# Patient Record
Sex: Female | Born: 1955
Health system: Southern US, Community
[De-identification: ages and names within clinical notes are randomized; demographics above are authoritative.]

## PROBLEM LIST (undated history)

## (undated) DIAGNOSIS — B0229 Other postherpetic nervous system involvement: Secondary | ICD-10-CM

## (undated) DIAGNOSIS — E785 Hyperlipidemia, unspecified: Secondary | ICD-10-CM

## (undated) DIAGNOSIS — I10 Essential (primary) hypertension: Secondary | ICD-10-CM

## (undated) DIAGNOSIS — H409 Unspecified glaucoma: Secondary | ICD-10-CM

## (undated) DIAGNOSIS — G4730 Sleep apnea, unspecified: Secondary | ICD-10-CM

## (undated) DIAGNOSIS — Z86718 Personal history of other venous thrombosis and embolism: Secondary | ICD-10-CM

## (undated) DIAGNOSIS — M1990 Unspecified osteoarthritis, unspecified site: Secondary | ICD-10-CM

## (undated) HISTORY — DX: Hyperlipidemia, unspecified: E78.5

## (undated) HISTORY — DX: Unspecified glaucoma: H40.9

## (undated) HISTORY — DX: Sleep apnea, unspecified: G47.30

## (undated) HISTORY — DX: Personal history of other venous thrombosis and embolism: Z86.718

## (undated) HISTORY — DX: Other postherpetic nervous system involvement: B02.29

## (undated) HISTORY — DX: Unspecified osteoarthritis, unspecified site: M19.90

## (undated) HISTORY — DX: Essential (primary) hypertension: I10

---

## 2001-11-06 ENCOUNTER — Encounter (HOSPITAL_COMMUNITY): Admission: RE | Admit: 2001-11-06 | Discharge: 2001-12-06 | Payer: Self-pay | Admitting: Preventative Medicine

## 2002-07-10 ENCOUNTER — Emergency Department (HOSPITAL_COMMUNITY): Admission: EM | Admit: 2002-07-10 | Discharge: 2002-07-11 | Payer: Self-pay | Admitting: Emergency Medicine

## 2002-07-11 ENCOUNTER — Encounter: Payer: Self-pay | Admitting: Emergency Medicine

## 2002-12-25 ENCOUNTER — Encounter: Payer: Self-pay | Admitting: Obstetrics and Gynecology

## 2002-12-25 ENCOUNTER — Ambulatory Visit (HOSPITAL_COMMUNITY): Admission: RE | Admit: 2002-12-25 | Discharge: 2002-12-25 | Payer: Self-pay | Admitting: Obstetrics and Gynecology

## 2004-09-04 ENCOUNTER — Ambulatory Visit (HOSPITAL_COMMUNITY): Admission: RE | Admit: 2004-09-04 | Discharge: 2004-09-04 | Payer: Self-pay | Admitting: Obstetrics and Gynecology

## 2005-06-29 ENCOUNTER — Encounter: Admission: RE | Admit: 2005-06-29 | Discharge: 2005-06-29 | Payer: Self-pay | Admitting: Oncology

## 2005-06-29 ENCOUNTER — Ambulatory Visit (HOSPITAL_COMMUNITY): Payer: Self-pay | Admitting: Oncology

## 2005-12-31 ENCOUNTER — Encounter: Admission: RE | Admit: 2005-12-31 | Discharge: 2005-12-31 | Payer: Self-pay | Admitting: Oncology

## 2005-12-31 ENCOUNTER — Ambulatory Visit (HOSPITAL_COMMUNITY): Payer: Self-pay | Admitting: Oncology

## 2005-12-31 ENCOUNTER — Encounter (HOSPITAL_COMMUNITY): Admission: RE | Admit: 2005-12-31 | Discharge: 2006-01-30 | Payer: Self-pay | Admitting: Oncology

## 2005-12-31 ENCOUNTER — Ambulatory Visit (HOSPITAL_COMMUNITY): Admission: RE | Admit: 2005-12-31 | Discharge: 2005-12-31 | Payer: Self-pay | Admitting: Obstetrics and Gynecology

## 2007-01-24 ENCOUNTER — Ambulatory Visit (HOSPITAL_COMMUNITY): Admission: RE | Admit: 2007-01-24 | Discharge: 2007-01-24 | Payer: Self-pay | Admitting: Internal Medicine

## 2007-01-31 ENCOUNTER — Ambulatory Visit (HOSPITAL_COMMUNITY): Admission: RE | Admit: 2007-01-31 | Discharge: 2007-01-31 | Payer: Self-pay | Admitting: Obstetrics and Gynecology

## 2007-05-22 ENCOUNTER — Ambulatory Visit: Payer: Self-pay | Admitting: *Deleted

## 2008-06-23 ENCOUNTER — Ambulatory Visit (HOSPITAL_COMMUNITY): Admission: RE | Admit: 2008-06-23 | Discharge: 2008-06-23 | Payer: Self-pay | Admitting: Gastroenterology

## 2008-06-23 ENCOUNTER — Ambulatory Visit: Payer: Self-pay | Admitting: Gastroenterology

## 2008-10-19 ENCOUNTER — Ambulatory Visit (HOSPITAL_COMMUNITY): Admission: RE | Admit: 2008-10-19 | Discharge: 2008-10-19 | Payer: Self-pay | Admitting: Obstetrics and Gynecology

## 2009-11-08 ENCOUNTER — Ambulatory Visit (HOSPITAL_COMMUNITY): Admission: RE | Admit: 2009-11-08 | Discharge: 2009-11-08 | Payer: Self-pay | Admitting: Obstetrics and Gynecology

## 2010-09-10 ENCOUNTER — Encounter: Payer: Self-pay | Admitting: Obstetrics and Gynecology

## 2010-12-14 ENCOUNTER — Other Ambulatory Visit (HOSPITAL_COMMUNITY): Payer: Self-pay | Admitting: Obstetrics and Gynecology

## 2010-12-14 DIAGNOSIS — Z139 Encounter for screening, unspecified: Secondary | ICD-10-CM

## 2010-12-28 ENCOUNTER — Ambulatory Visit (HOSPITAL_COMMUNITY)
Admission: RE | Admit: 2010-12-28 | Discharge: 2010-12-28 | Disposition: A | Discharge status: Home / Self Care | Payer: BC Managed Care – PPO | Source: Ambulatory Visit | Attending: Obstetrics and Gynecology | Admitting: Obstetrics and Gynecology

## 2010-12-28 DIAGNOSIS — Z139 Encounter for screening, unspecified: Secondary | ICD-10-CM

## 2010-12-28 DIAGNOSIS — Z1231 Encounter for screening mammogram for malignant neoplasm of breast: Secondary | ICD-10-CM | POA: Insufficient documentation

## 2011-01-02 NOTE — Consult Note (Signed)
VASCULAR SURGERY CONSULTATION   Janice Gonzales, Janice Gonzales  DOB:  Jul 07, 1956                                       05/22/2007  XBDZH#:[id number]   REFERRING PHYSICIAN:  Patrica Duel, M.D.   REASON FOR CONSULTATION:  Bilateral leg swelling.   HISTORY:  Patient is a 55 year old female with a history of DVT and  pulmonary embolus two years ago.  This occurred following a long  automobile trip.  Also, she was on birth control pills at that time.  Workup for hypercoagulable state was negative.  She now complains of  bilateral ankle swelling.  This was most severe in the summertime.  She  does work at [employer].  She spends a moderate amount of  time on her feet.  She recently had her Diltiazem discontinued with  conversion to triamterene/hydrochlorothiazide with her hypertension  controlled.  This resulted in significant improvement in her ankle  swelling.  She does try to restrict salt intake.   She notes also some tingling of her toes and feet at times.   PAST MEDICAL HISTORY:  1. Hypertension.  2. Venous thromboembolism with pulmonary embolus.  3. Iron-deficiency anemia.   SOCIAL HISTORY:  She is recently remarried.  Has no children.  Works in  Airline pilot at [employer].  Nonsmoker.  Nondrinker.   MEDICATIONS:  1. Triamterene/hydrochlorothiazide 37.5/25 1 tablet daily.  2. Iron 325 mg daily.   ALLERGIES:  TETRACYCLINE.   REVIEW OF SYSTEMS:  She does have spider veins in her legs.  Notes  occasional redness around her ankles and calves with tightness and some  numbness in her feet.   PHYSICAL EXAMINATION:  A moderately obese 55 year old female.  Alert and  oriented.  No acute distress.  BP 160/109.  Pulse 65 per minute,  regular.  Respirations 18 per minute.  Lower extremity evaluation  reveals spider veins bilaterally.  No large varicosities are evident.  No pigmentation.  Ankle edema 1+.  Intact popliteal, posterior tibial,  and dorsalis  pedis pulses.   IMPRESSION:  1. Chronic lower extremity edema, recently improved by medication      change with discontinuation of Diltiazem.  2. Spider veins.  3. Hypertension.  4. History of venous thromboembolism.   RECOMMENDATIONS:  Continue to limit salt intake.  Leg elevation when  feasible.  Use support stockings below knee p.r.n.  At patient's  request, have given information regarding the vein clinic and treatment  of spider veins.   Balinda Quails, M.D.  Electronically Signed  [facility]/MEDQ  D:  05/22/2007  T:  05/23/2007  Job:  [id number]

## 2011-01-02 NOTE — Op Note (Signed)
Janice Gonzales, Janice Gonzales              ACCOUNT NO.:  [id number]   MEDICAL RECORD NO.:  [id number]          PATIENT TYPE:  AMB   LOCATION:  DAY                           FACILITY:  [facility]   PHYSICIAN:  Kassie Mends, M.D.      DATE OF BIRTH:  October 15, 1955   DATE OF PROCEDURE:  06/23/2008  DATE OF DISCHARGE:                               OPERATIVE REPORT   REFERRING Kymani Shimabukuro:  Melony Overly, PA   PROCEDURE:  Colonoscopy.   INDICATION FOR EXAM:  Janice Gonzales is a 55 year old female who presents  for average-risk colon cancer screening.  She does have a second-degree  relative that had polyps at age greater than 84.   FINDINGS:  1. Slightly tortuous colon.  Otherwise, normal colon without evidence      of polyps, mass, inflammatory changes, diverticular, or AVMs.  2. Normal retroflexed view of the rectum.   RECOMMENDATIONS:  1. Screening colonoscopy in 10 years.  2. She should follow a high-fiber diet.  She is given a handout on      high-fiber diet.   MEDICATIONS:  1. Demerol 75 mg IV.  2. Versed 4 mg IV.   PROCEDURE TECHNIQUE:  Physical exam was performed.  Informed consent was  obtained from the patient after explaining the benefits, risks, and  alternatives to the procedure.  The patient was connected to the monitor  and placed in left lateral position.  Continuous oxygen was provided by  nasal cannula and IV medicine administered through an indwelling  cannula.  After administration of sedation and rectal exam, the  patient's rectum was intubated  and the scope was advanced under direct visualization to the cecum.  The  scope was removed slowly by carefully examining the color, texture,  anatomy, and integrity of the mucosa on the way out.  The patient was  recovered in endoscopy and discharged home in satisfactory condition.      Kassie Mends, M.D.  Electronically Signed     SM/MEDQ  D:  06/23/2008  T:  06/23/2008  Job:  [id number]   cc:   Patrica Duel, M.D.  Fax: [phone]

## 2011-12-13 ENCOUNTER — Other Ambulatory Visit (HOSPITAL_COMMUNITY): Payer: Self-pay | Admitting: Physician Assistant

## 2011-12-13 DIAGNOSIS — Z139 Encounter for screening, unspecified: Secondary | ICD-10-CM

## 2011-12-31 ENCOUNTER — Ambulatory Visit (HOSPITAL_COMMUNITY)
Admission: RE | Admit: 2011-12-31 | Discharge: 2011-12-31 | Disposition: A | Discharge status: Home / Self Care | Payer: BC Managed Care – PPO | Source: Ambulatory Visit | Attending: Physician Assistant | Admitting: Physician Assistant

## 2011-12-31 DIAGNOSIS — Z1231 Encounter for screening mammogram for malignant neoplasm of breast: Secondary | ICD-10-CM | POA: Insufficient documentation

## 2011-12-31 DIAGNOSIS — Z139 Encounter for screening, unspecified: Secondary | ICD-10-CM

## 2012-01-29 ENCOUNTER — Other Ambulatory Visit (HOSPITAL_COMMUNITY): Payer: Self-pay | Admitting: Family Medicine

## 2012-01-29 ENCOUNTER — Ambulatory Visit (HOSPITAL_COMMUNITY)
Admission: RE | Admit: 2012-01-29 | Discharge: 2012-01-29 | Disposition: A | Discharge status: Home / Self Care | Payer: BC Managed Care – PPO | Source: Ambulatory Visit | Attending: Family Medicine | Admitting: Family Medicine

## 2012-01-29 DIAGNOSIS — M25569 Pain in unspecified knee: Secondary | ICD-10-CM | POA: Insufficient documentation

## 2012-01-30 ENCOUNTER — Ambulatory Visit (HOSPITAL_COMMUNITY)
Admission: RE | Admit: 2012-01-30 | Discharge: 2012-01-30 | Disposition: A | Discharge status: Home / Self Care | Payer: BC Managed Care – PPO | Source: Ambulatory Visit | Attending: Family Medicine | Admitting: Family Medicine

## 2012-01-30 DIAGNOSIS — M25569 Pain in unspecified knee: Secondary | ICD-10-CM

## 2012-01-30 DIAGNOSIS — M79609 Pain in unspecified limb: Secondary | ICD-10-CM | POA: Insufficient documentation

## 2014-02-12 ENCOUNTER — Other Ambulatory Visit (HOSPITAL_COMMUNITY): Payer: Self-pay | Admitting: Obstetrics & Gynecology

## 2014-02-12 DIAGNOSIS — Z1231 Encounter for screening mammogram for malignant neoplasm of breast: Secondary | ICD-10-CM

## 2014-03-12 ENCOUNTER — Ambulatory Visit (HOSPITAL_COMMUNITY): Payer: BC Managed Care – PPO

## 2014-03-17 ENCOUNTER — Ambulatory Visit (HOSPITAL_COMMUNITY)
Admission: RE | Admit: 2014-03-17 | Discharge: 2014-03-17 | Disposition: A | Discharge status: Home / Self Care | Payer: BC Managed Care – PPO | Source: Ambulatory Visit | Attending: Obstetrics & Gynecology | Admitting: Obstetrics & Gynecology

## 2014-03-17 DIAGNOSIS — Z1231 Encounter for screening mammogram for malignant neoplasm of breast: Secondary | ICD-10-CM | POA: Insufficient documentation

## 2015-07-19 ENCOUNTER — Other Ambulatory Visit (HOSPITAL_COMMUNITY): Payer: Self-pay | Admitting: Obstetrics & Gynecology

## 2015-07-19 DIAGNOSIS — Z1231 Encounter for screening mammogram for malignant neoplasm of breast: Secondary | ICD-10-CM

## 2015-08-03 ENCOUNTER — Ambulatory Visit (HOSPITAL_COMMUNITY): Payer: Self-pay

## 2015-08-03 ENCOUNTER — Ambulatory Visit (HOSPITAL_COMMUNITY)
Admission: RE | Admit: 2015-08-03 | Discharge: 2015-08-03 | Disposition: A | Discharge status: Home / Self Care | Payer: BLUE CROSS/BLUE SHIELD | Source: Ambulatory Visit | Attending: Obstetrics & Gynecology | Admitting: Obstetrics & Gynecology

## 2015-08-03 DIAGNOSIS — Z1231 Encounter for screening mammogram for malignant neoplasm of breast: Secondary | ICD-10-CM | POA: Diagnosis present

## 2016-07-03 DIAGNOSIS — L821 Other seborrheic keratosis: Secondary | ICD-10-CM | POA: Diagnosis not present

## 2016-07-03 DIAGNOSIS — L570 Actinic keratosis: Secondary | ICD-10-CM | POA: Diagnosis not present

## 2016-07-03 DIAGNOSIS — D1800 Hemangioma unspecified site: Secondary | ICD-10-CM | POA: Diagnosis not present

## 2016-10-01 ENCOUNTER — Other Ambulatory Visit (HOSPITAL_COMMUNITY): Payer: Self-pay | Admitting: Obstetrics & Gynecology

## 2016-10-01 DIAGNOSIS — Z1231 Encounter for screening mammogram for malignant neoplasm of breast: Secondary | ICD-10-CM

## 2016-10-02 DIAGNOSIS — H40023 Open angle with borderline findings, high risk, bilateral: Secondary | ICD-10-CM | POA: Diagnosis not present

## 2016-10-11 DIAGNOSIS — H8110 Benign paroxysmal vertigo, unspecified ear: Secondary | ICD-10-CM | POA: Diagnosis not present

## 2016-10-11 DIAGNOSIS — Z6833 Body mass index (BMI) 33.0-33.9, adult: Secondary | ICD-10-CM | POA: Diagnosis not present

## 2016-10-11 DIAGNOSIS — Z1389 Encounter for screening for other disorder: Secondary | ICD-10-CM | POA: Diagnosis not present

## 2016-10-11 DIAGNOSIS — I10 Essential (primary) hypertension: Secondary | ICD-10-CM | POA: Diagnosis not present

## 2016-10-23 DIAGNOSIS — Z6833 Body mass index (BMI) 33.0-33.9, adult: Secondary | ICD-10-CM | POA: Diagnosis not present

## 2016-10-23 DIAGNOSIS — Z1389 Encounter for screening for other disorder: Secondary | ICD-10-CM | POA: Diagnosis not present

## 2016-10-23 DIAGNOSIS — I10 Essential (primary) hypertension: Secondary | ICD-10-CM | POA: Diagnosis not present

## 2016-10-23 DIAGNOSIS — H8110 Benign paroxysmal vertigo, unspecified ear: Secondary | ICD-10-CM | POA: Diagnosis not present

## 2016-10-24 ENCOUNTER — Ambulatory Visit (HOSPITAL_COMMUNITY): Payer: BLUE CROSS/BLUE SHIELD

## 2016-10-24 DIAGNOSIS — H40023 Open angle with borderline findings, high risk, bilateral: Secondary | ICD-10-CM | POA: Diagnosis not present

## 2016-10-29 ENCOUNTER — Ambulatory Visit (HOSPITAL_COMMUNITY)
Admission: RE | Admit: 2016-10-29 | Discharge: 2016-10-29 | Disposition: A | Discharge status: Home / Self Care | Payer: BLUE CROSS/BLUE SHIELD | Source: Ambulatory Visit | Attending: Obstetrics & Gynecology | Admitting: Obstetrics & Gynecology

## 2016-10-29 DIAGNOSIS — Z1231 Encounter for screening mammogram for malignant neoplasm of breast: Secondary | ICD-10-CM | POA: Insufficient documentation

## 2016-11-20 DIAGNOSIS — Z01419 Encounter for gynecological examination (general) (routine) without abnormal findings: Secondary | ICD-10-CM | POA: Diagnosis not present

## 2016-11-20 DIAGNOSIS — Z6833 Body mass index (BMI) 33.0-33.9, adult: Secondary | ICD-10-CM | POA: Diagnosis not present

## 2016-12-13 ENCOUNTER — Ambulatory Visit (INDEPENDENT_AMBULATORY_CARE_PROVIDER_SITE_OTHER): Payer: BLUE CROSS/BLUE SHIELD | Admitting: Otolaryngology

## 2016-12-13 DIAGNOSIS — H608X3 Other otitis externa, bilateral: Secondary | ICD-10-CM | POA: Diagnosis not present

## 2016-12-13 DIAGNOSIS — R42 Dizziness and giddiness: Secondary | ICD-10-CM

## 2016-12-13 DIAGNOSIS — H903 Sensorineural hearing loss, bilateral: Secondary | ICD-10-CM

## 2016-12-24 DIAGNOSIS — Z6832 Body mass index (BMI) 32.0-32.9, adult: Secondary | ICD-10-CM | POA: Diagnosis not present

## 2016-12-24 DIAGNOSIS — Z1389 Encounter for screening for other disorder: Secondary | ICD-10-CM | POA: Diagnosis not present

## 2016-12-24 DIAGNOSIS — J811 Chronic pulmonary edema: Secondary | ICD-10-CM | POA: Diagnosis not present

## 2016-12-24 DIAGNOSIS — R0982 Postnasal drip: Secondary | ICD-10-CM | POA: Diagnosis not present

## 2016-12-24 DIAGNOSIS — R05 Cough: Secondary | ICD-10-CM | POA: Diagnosis not present

## 2017-05-14 DIAGNOSIS — Z6832 Body mass index (BMI) 32.0-32.9, adult: Secondary | ICD-10-CM | POA: Diagnosis not present

## 2017-05-14 DIAGNOSIS — E6609 Other obesity due to excess calories: Secondary | ICD-10-CM | POA: Diagnosis not present

## 2017-05-14 DIAGNOSIS — B029 Zoster without complications: Secondary | ICD-10-CM | POA: Diagnosis not present

## 2017-05-20 DIAGNOSIS — B029 Zoster without complications: Secondary | ICD-10-CM | POA: Diagnosis not present

## 2017-05-20 DIAGNOSIS — Z1389 Encounter for screening for other disorder: Secondary | ICD-10-CM | POA: Diagnosis not present

## 2017-05-20 DIAGNOSIS — E6609 Other obesity due to excess calories: Secondary | ICD-10-CM | POA: Diagnosis not present

## 2017-05-20 DIAGNOSIS — Z6832 Body mass index (BMI) 32.0-32.9, adult: Secondary | ICD-10-CM | POA: Diagnosis not present

## 2017-06-17 ENCOUNTER — Encounter: Payer: Self-pay | Admitting: Family Medicine

## 2017-06-17 ENCOUNTER — Ambulatory Visit (INDEPENDENT_AMBULATORY_CARE_PROVIDER_SITE_OTHER): Payer: BLUE CROSS/BLUE SHIELD | Admitting: Family Medicine

## 2017-06-17 VITALS — BP 118/74 | HR 88 | Temp 97.4°F | Resp 16 | Ht 65.0 in | Wt 193.0 lb

## 2017-06-17 DIAGNOSIS — E785 Hyperlipidemia, unspecified: Secondary | ICD-10-CM

## 2017-06-17 DIAGNOSIS — E559 Vitamin D deficiency, unspecified: Secondary | ICD-10-CM | POA: Diagnosis not present

## 2017-06-17 DIAGNOSIS — E538 Deficiency of other specified B group vitamins: Secondary | ICD-10-CM

## 2017-06-17 DIAGNOSIS — Z86711 Personal history of pulmonary embolism: Secondary | ICD-10-CM | POA: Diagnosis not present

## 2017-06-17 DIAGNOSIS — Z23 Encounter for immunization: Secondary | ICD-10-CM | POA: Diagnosis not present

## 2017-06-17 DIAGNOSIS — I10 Essential (primary) hypertension: Secondary | ICD-10-CM | POA: Diagnosis not present

## 2017-06-17 DIAGNOSIS — Z1159 Encounter for screening for other viral diseases: Secondary | ICD-10-CM

## 2017-06-17 DIAGNOSIS — B0229 Other postherpetic nervous system involvement: Secondary | ICD-10-CM

## 2017-06-17 HISTORY — DX: Other postherpetic nervous system involvement: B02.29

## 2017-06-17 MED ORDER — GABAPENTIN 300 MG PO CAPS: 300.0000 mg | ORAL_CAPSULE | Freq: Every day | ORAL | 2 refills | Status: DC

## 2017-06-17 NOTE — Patient Instructions (Addendum)
Need flu shot Need blood tests I will send you a letter with your test results.  If there is anything of concern, we will call right away. Need old records - PCP and GYN Try the gabapentin before bed for the nerve pain  See me in a month for a PE

## 2017-06-17 NOTE — Progress Notes (Signed)
Chief Complaint  Patient presents with  . Follow-up    est  This is a new patient to establish. Essential hypertension on Cozaar 100 mg a day.  Compliant with medication.  Compliant with diet and salt restriction.  Not compliant with daily exercise.  Mild overweight.  Blood pressures well controlled. Remote history of DVT and pulmonary embolism in 2006.  The patient was at the time on oral contraceptives.  These were discontinued.  She is never been a smoker. Patient had shingles about 5 weeks ago.  She still has shingles pain.  The rash is slowly resolving.  She was treated with valacyclovir.  She took tramadol for pain.  She is has trouble especially in the afternoon and evening.  She questions when she can get a shingles shot. Pap smears and mammogram are up-to-date.  She sees a GYN regularly. She had a colonoscopy in 2009 at age 61.  She is due again next year     Patient Active Problem List   Diagnosis Date Noted  . Essential hypertension 06/17/2017  . Post herpetic neuralgia 06/17/2017  . History of pulmonary embolism 06/17/2017    Outpatient Encounter Prescriptions as of 06/17/2017  Medication Sig  . aspirin EC 81 MG tablet Take 81 mg by mouth daily.  Marland Kitchen. gabapentin (NEURONTIN) 300 MG capsule Take 1 capsule (300 mg total) by mouth at bedtime.  Marland Kitchen. losartan (COZAAR) 100 MG tablet Take 100 mg by mouth daily.   No facility-administered encounter medications on file as of 06/17/2017.     Past Medical History:  Diagnosis Date  . Arthritis   . Glaucoma   . History of DVT of lower extremity    with PE  . Hypertension   . Post herpetic neuralgia 06/17/2017  . Sleep apnea     History reviewed. No pertinent surgical history.  Social History   Social History  . Marital status: Married    Spouse name: Jonny RuizJohn  . Number of children: 0  . Years of education: 7616   Occupational History  . [employer]    Social History Main Topics  . Smoking status: Never Smoker    . Smokeless tobacco: Never Used  . Alcohol use No  . Drug use: No  . Sexual activity: Yes    Birth control/ protection: Post-menopausal   Other Topics Concern  . Not on file   Social History Narrative   BS in business admin   Married to The PepsiJohn   Lives to walk   Had many step children and grandchildren who keep her active    Family History  Problem Relation Age of Onset  . Diabetes Mother   . Hyperlipidemia Mother   . Stroke Mother   . Alcohol abuse Father   . Arthritis Father   . COPD Father   . Heart disease Father   . Hypertension Father   . Heart disease Maternal Grandmother        heart attack  . Cancer Paternal Grandmother        breast cancer    Review of Systems  Constitutional: Negative for chills, fever and weight loss.  HENT: Negative for congestion and hearing loss.   Eyes: Negative for blurred vision and pain.  Respiratory: Negative for cough and shortness of breath.   Cardiovascular: Negative for chest pain and leg swelling.  Gastrointestinal: Negative for abdominal pain, constipation, diarrhea and heartburn.  Genitourinary: Positive for flank pain. Negative for dysuria and frequency.  Flank pain from herpetic neuralgia  Musculoskeletal: Negative for falls, joint pain and myalgias.  Neurological: Negative for dizziness, seizures and headaches.  Psychiatric/Behavioral: Negative for depression. The patient is not nervous/anxious and does not have insomnia.     BP 118/74 (BP Location: Right Arm, Patient Position: Sitting, Cuff Size: Normal)   Pulse 88   Temp (!) 97.4 F (36.3 C) (Temporal)   Resp 16   Ht 5\' 5"  (1.651 m)   Wt 193 lb 0.6 oz (87.6 kg)   SpO2 97%   BMI 32.12 kg/m   Physical Exam  Constitutional: She is oriented to person, place, and time. She appears well-developed and well-nourished.  HENT:  Head: Normocephalic and atraumatic.  Mouth/Throat: Oropharynx is clear and moist.  Eyes: Pupils are equal, round, and reactive to light.  Conjunctivae are normal.  Neck: Normal range of motion. Neck supple. No thyromegaly present.  Cardiovascular: Normal rate, regular rhythm and normal heart sounds.   Pulmonary/Chest: Effort normal and breath sounds normal. No respiratory distress.        Abdominal: Soft. Bowel sounds are normal.  Musculoskeletal: Normal range of motion. She exhibits no edema.  Lymphadenopathy:    She has no cervical adenopathy.  Neurological: She is alert and oriented to person, place, and time.  Gait normal  Skin: Skin is warm and dry.  Psychiatric: She has a normal mood and affect. Her behavior is normal. Thought content normal.  Nursing note and vitals reviewed.  ASSESSMENT/PLAN:  1. Essential hypertension Controlled - CBC - COMPLETE METABOLIC PANEL WITH GFR - Urinalysis, Routine w reflex microscopic  2. Post herpetic neuralgia Persistent pain.  We will add gabapentin at bedtime.  3. History of pulmonary embolism Remote history.  Compliant with aspirin daily  4. Hyperlipidemia, mild History of hyperlipidemia.  On no treatment. - Lipid panel  5. Encounter for hepatitis C screening test for low risk patient Discussed hepatitis C.  Patient desires testing.  She is low risk - Hepatitis C antibody  6. Vitamin D deficiency History of - VITAMIN D 25 Hydroxy (Vit-D Deficiency, Fractures)  7. Vitamin B 12 deficiency History - Vitamin B12  8. Needs flu shot Done - Flu Vaccine QUAD 36+ mos IM   Patient Instructions  Need flu shot Need blood tests I will send you a letter with your test results.  If there is anything of concern, we will call right away. Need old records - PCP and GYN Try the gabapentin before bed for the nerve pain  See me in a month for a PE    Eustace Moore, MD

## 2017-06-22 LAB — URINALYSIS, ROUTINE W REFLEX MICROSCOPIC
BACTERIA UA: NONE SEEN /HPF
Bilirubin Urine: NEGATIVE
Glucose, UA: NEGATIVE
HGB URINE DIPSTICK: NEGATIVE
HYALINE CAST: NONE SEEN /LPF
KETONES UR: NEGATIVE
Nitrite: NEGATIVE
PH: 7 (ref 5.0–8.0)
Protein, ur: NEGATIVE
SPECIFIC GRAVITY, URINE: 1.015 (ref 1.001–1.03)

## 2017-06-22 LAB — LIPID PANEL
CHOL/HDL RATIO: 5 (calc) — AB (ref <5.0)
Cholesterol: 209 mg/dL — ABNORMAL HIGH (ref <200)
HDL: 42 mg/dL — AB (ref >50)
LDL CHOLESTEROL (CALC): 131 mg/dL — AB
NON-HDL CHOLESTEROL (CALC): 167 mg/dL — AB (ref <130)
Triglycerides: 217 mg/dL — ABNORMAL HIGH (ref <150)

## 2017-06-22 LAB — CBC
HEMATOCRIT: 38.2 % (ref 35.0–45.0)
HEMOGLOBIN: 12.7 g/dL (ref 11.7–15.5)
MCH: 27.5 pg (ref 27.0–33.0)
MCHC: 33.2 g/dL (ref 32.0–36.0)
MCV: 82.9 fL (ref 80.0–100.0)
MPV: 11.8 fL (ref 7.5–12.5)
Platelets: 206 10*3/uL (ref 140–400)
RBC: 4.61 10*6/uL (ref 3.80–5.10)
RDW: 13.4 % (ref 11.0–15.0)
WBC: 9.8 10*3/uL (ref 3.8–10.8)

## 2017-06-22 LAB — COMPLETE METABOLIC PANEL WITH GFR
AG Ratio: 1.7 (calc) (ref 1.0–2.5)
ALBUMIN MSPROF: 4.3 g/dL (ref 3.6–5.1)
ALT: 16 U/L (ref 6–29)
AST: 13 U/L (ref 10–35)
Alkaline phosphatase (APISO): 76 U/L (ref 33–130)
BUN: 15 mg/dL (ref 7–25)
CALCIUM: 9.1 mg/dL (ref 8.6–10.4)
CO2: 27 mmol/L (ref 20–32)
CREATININE: 0.79 mg/dL (ref 0.50–1.05)
Chloride: 102 mmol/L (ref 98–110)
GFR, EST NON AFRICAN AMERICAN: 82 mL/min/{1.73_m2} (ref >=60)
GFR, Est African American: 95 mL/min/{1.73_m2} (ref >=60)
GLOBULIN: 2.6 g/dL (ref 1.9–3.7)
GLUCOSE: 99 mg/dL (ref 65–99)
Potassium: 4.2 mmol/L (ref 3.5–5.3)
SODIUM: 139 mmol/L (ref 135–146)
Total Bilirubin: 0.7 mg/dL (ref 0.2–1.2)
Total Protein: 6.9 g/dL (ref 6.1–8.1)

## 2017-06-22 LAB — HEPATITIS C ANTIBODY
HEP C AB: NONREACTIVE
SIGNAL TO CUT-OFF: 0.01 (ref <1.00)

## 2017-06-22 LAB — VITAMIN D 25 HYDROXY (VIT D DEFICIENCY, FRACTURES): Vit D, 25-Hydroxy: 13 ng/mL — ABNORMAL LOW (ref 30–100)

## 2017-06-22 LAB — VITAMIN B12: VITAMIN B 12: 370 pg/mL (ref 200–1100)

## 2017-06-25 ENCOUNTER — Encounter: Payer: Self-pay | Admitting: Family Medicine

## 2017-07-03 DIAGNOSIS — L570 Actinic keratosis: Secondary | ICD-10-CM | POA: Diagnosis not present

## 2017-07-04 DIAGNOSIS — H40023 Open angle with borderline findings, high risk, bilateral: Secondary | ICD-10-CM | POA: Diagnosis not present

## 2017-07-05 ENCOUNTER — Encounter: Payer: Self-pay | Admitting: Family Medicine

## 2017-07-05 ENCOUNTER — Ambulatory Visit: Payer: BLUE CROSS/BLUE SHIELD | Admitting: Family Medicine

## 2017-07-05 ENCOUNTER — Other Ambulatory Visit: Payer: Self-pay

## 2017-07-05 VITALS — BP 130/84 | HR 84 | Temp 97.7°F | Resp 16 | Ht 65.0 in | Wt 189.1 lb

## 2017-07-05 DIAGNOSIS — E559 Vitamin D deficiency, unspecified: Secondary | ICD-10-CM | POA: Diagnosis not present

## 2017-07-05 DIAGNOSIS — E785 Hyperlipidemia, unspecified: Secondary | ICD-10-CM

## 2017-07-05 DIAGNOSIS — I10 Essential (primary) hypertension: Secondary | ICD-10-CM

## 2017-07-05 DIAGNOSIS — J209 Acute bronchitis, unspecified: Secondary | ICD-10-CM

## 2017-07-05 HISTORY — DX: Hyperlipidemia, unspecified: E78.5

## 2017-07-05 MED ORDER — AZITHROMYCIN 250 MG PO TABS: ORAL_TABLET | ORAL | 0 refills | Status: DC

## 2017-07-05 MED ORDER — BENZONATATE 100 MG PO CAPS: 100.0000 mg | ORAL_CAPSULE | Freq: Two times a day (BID) | ORAL | 0 refills | Status: DC | PRN

## 2017-07-05 MED ORDER — VITAMIN D (ERGOCALCIFEROL) 1.25 MG (50000 UNIT) PO CAPS: 50000.0000 [IU] | ORAL_CAPSULE | ORAL | 0 refills | Status: DC

## 2017-07-05 NOTE — Progress Notes (Signed)
Chief Complaint  Patient presents with  . Cough    x 1 week  Patient is here for cough and cold.  She is been coughing for a week.  The cough is keeping her awake at night.  She states she is prone to "bronchitis".  She has not had any fever.  She thinks she had some chills last night.  She does not have any chest pain.  Scant sputum.  No runny or stuffy nose, sinus pressure or pain, or sore throat.  No underlying COPD or asthma. While here, she would like to discuss her recent lab results.  Everything was normal or as expected except hyperlipidemia.  I calculated her Framingham risk of cardiovascular disease over the next 10 years and it is 11%.  I explained to her that that would be an indication to start a statin.  She would like to try diet and exercise instead.  We will repeat lipids in 6 months.  She also was found to be vitamin D deficient.  Her vitamin D level is 13.  We discussed causes of vitamin D deficiency, nutritional sources of vitamin D, and sunshine.  I am going to put her on vitamin D 50,000 units a week for 12 weeks, then she will take an over-the-counter product daily.   Patient Active Problem List   Diagnosis Date Noted  . Hyperlipidemia 07/05/2017  . Vitamin D deficiency 07/05/2017  . Essential hypertension 06/17/2017  . Post herpetic neuralgia 06/17/2017  . History of pulmonary embolism 06/17/2017    Outpatient Encounter Medications as of 07/05/2017  Medication Sig  . aspirin EC 81 MG tablet Take 81 mg by mouth daily.  Marland Kitchen. gabapentin (NEURONTIN) 300 MG capsule Take 1 capsule (300 mg total) by mouth at bedtime.  Marland Kitchen. losartan (COZAAR) 100 MG tablet Take 100 mg by mouth daily.  Marland Kitchen. azithromycin (ZITHROMAX) 250 MG tablet tad  . benzonatate (TESSALON) 100 MG capsule Take 1 capsule (100 mg total) 2 (two) times daily as needed by mouth for cough.  . Vitamin D, Ergocalciferol, (DRISDOL) 50000 units CAPS capsule Take 1 capsule (50,000 Units total) every 7 (seven) days by mouth.     No facility-administered encounter medications on file as of 07/05/2017.     Allergies  Allergen Reactions  . Tetracyclines & Related Itching  . Thimerosal Itching    Review of Systems  Constitutional: Positive for chills. Negative for activity change, appetite change, fatigue and unexpected weight change.  HENT: Negative for congestion, dental problem, postnasal drip and rhinorrhea.   Eyes: Negative for redness and visual disturbance.  Respiratory: Positive for cough. Negative for shortness of breath.   Cardiovascular: Negative for chest pain, palpitations and leg swelling.  Gastrointestinal: Negative for abdominal pain, constipation and diarrhea.  Genitourinary: Negative for difficulty urinating, frequency and menstrual problem.  Musculoskeletal: Negative for arthralgias and back pain.  Neurological: Negative for dizziness and headaches.  Psychiatric/Behavioral: Positive for sleep disturbance. Negative for dysphoric mood. The patient is not nervous/anxious.     BP 130/84 (BP Location: Left Arm, Patient Position: Sitting, Cuff Size: Normal)   Pulse 84   Temp 97.7 F (36.5 C) (Temporal)   Resp 16   Ht 5\' 5"  (1.651 m)   Wt 189 lb 1.9 oz (85.8 kg)   SpO2 96%   BMI 31.47 kg/m   Physical Exam  Constitutional: She is oriented to person, place, and time. She appears well-developed and well-nourished.  Mildly ill, appears tired  HENT:  Head: Normocephalic  and atraumatic.  Right Ear: External ear normal.  Left Ear: External ear normal.  Mouth/Throat: Oropharynx is clear and moist.  Clear rhinorrhea  Eyes: Conjunctivae are normal. Pupils are equal, round, and reactive to light.  Neck: Normal range of motion. Neck supple. No thyromegaly present.  Cardiovascular: Normal rate, regular rhythm and normal heart sounds.  Pulmonary/Chest: Effort normal. No respiratory distress. She has wheezes.  Lungs are clear, rare scattered wheeze  Abdominal: Soft. Bowel sounds are normal.   Musculoskeletal: Normal range of motion. She exhibits no edema.  Lymphadenopathy:    She has no cervical adenopathy.  Neurological: She is alert and oriented to person, place, and time.  Gait normal  Skin: Skin is warm and dry.  Psychiatric: She has a normal mood and affect. Her behavior is normal. Thought content normal.  Nursing note and vitals reviewed.   ASSESSMENT/PLAN:  1. Acute bronchitis, unspecified organism We discussed that bronchitis is almost always caused by a virus.  I advised her to push fluids, get a humidifier, and use Tessalon and dextromethorphan for the cough.  If her symptoms last longer than 10 days or if she develops a fever, then she would take an antibiotic.  She was given a written prescription for azithromycin to fill and use if indicated  2. Vitamin D deficiency Discussed.  Replacement is advised.  3. Hyperlipidemia, unspecified hyperlipidemia type Discussed.  Low-cholesterol diet and regular exercise is recommended.  Repeat lipid panel upon return  4. Essential hypertension Controlled   Patient Instructions  Take "DM" and tessalon for the cough Push fluids Consider a humidifier in the bedroom Take the antibiotic if needed based on our conversation  Low cholesterol diet Walk daily  Take the vitamin D once a week for 12 weeks After this take 2000 u a day  See me in 6 month Call sooner for problems   Eustace MooreYvonne Sue Jacky Hartung, MD

## 2017-07-05 NOTE — Patient Instructions (Signed)
Take "DM" and tessalon for the cough Push fluids Consider a humidifier in the bedroom Take the antibiotic if needed based on our conversation  Low cholesterol diet Walk daily  Take the vitamin D once a week for 12 weeks After this take 2000 u a day  See me in 6 month Call sooner for problems

## 2017-07-26 ENCOUNTER — Encounter: Payer: BLUE CROSS/BLUE SHIELD | Admitting: Family Medicine

## 2017-09-26 ENCOUNTER — Telehealth: Payer: Self-pay | Admitting: Family Medicine

## 2017-09-26 NOTE — Telephone Encounter (Signed)
Patient wants to know what over the counter vitamin D to get an d when she can get her shingles vaccine. [phone]

## 2017-09-30 NOTE — Telephone Encounter (Signed)
Called [facility], advised to take vit d 3 2000 iu daily, is going to pharmacy for shingles shot.

## 2017-11-21 DIAGNOSIS — H40053 Ocular hypertension, bilateral: Secondary | ICD-10-CM | POA: Diagnosis not present

## 2018-01-02 ENCOUNTER — Other Ambulatory Visit: Payer: Self-pay

## 2018-01-02 ENCOUNTER — Emergency Department (HOSPITAL_COMMUNITY)
Admission: EM | Admit: 2018-01-02 | Discharge: 2018-01-03 | Disposition: A | Discharge status: Home / Self Care | Payer: BLUE CROSS/BLUE SHIELD | Attending: Emergency Medicine | Admitting: Emergency Medicine

## 2018-01-02 ENCOUNTER — Encounter: Payer: BLUE CROSS/BLUE SHIELD | Admitting: Family Medicine

## 2018-01-02 ENCOUNTER — Emergency Department (HOSPITAL_COMMUNITY): Payer: BLUE CROSS/BLUE SHIELD

## 2018-01-02 ENCOUNTER — Encounter (HOSPITAL_COMMUNITY): Payer: Self-pay | Admitting: Emergency Medicine

## 2018-01-02 DIAGNOSIS — S82831A Other fracture of upper and lower end of right fibula, initial encounter for closed fracture: Secondary | ICD-10-CM | POA: Diagnosis not present

## 2018-01-02 DIAGNOSIS — W19XXXA Unspecified fall, initial encounter: Secondary | ICD-10-CM

## 2018-01-02 DIAGNOSIS — Y998 Other external cause status: Secondary | ICD-10-CM | POA: Diagnosis not present

## 2018-01-02 DIAGNOSIS — Y93K9 Activity, other involving animal care: Secondary | ICD-10-CM | POA: Insufficient documentation

## 2018-01-02 DIAGNOSIS — Z79899 Other long term (current) drug therapy: Secondary | ICD-10-CM | POA: Diagnosis not present

## 2018-01-02 DIAGNOSIS — S8251XA Displaced fracture of medial malleolus of right tibia, initial encounter for closed fracture: Secondary | ICD-10-CM | POA: Diagnosis not present

## 2018-01-02 DIAGNOSIS — S9304XA Dislocation of right ankle joint, initial encounter: Secondary | ICD-10-CM | POA: Insufficient documentation

## 2018-01-02 DIAGNOSIS — W010XXA Fall on same level from slipping, tripping and stumbling without subsequent striking against object, initial encounter: Secondary | ICD-10-CM | POA: Diagnosis not present

## 2018-01-02 DIAGNOSIS — Z7982 Long term (current) use of aspirin: Secondary | ICD-10-CM | POA: Diagnosis not present

## 2018-01-02 DIAGNOSIS — S82891A Other fracture of right lower leg, initial encounter for closed fracture: Secondary | ICD-10-CM | POA: Insufficient documentation

## 2018-01-02 DIAGNOSIS — S99911A Unspecified injury of right ankle, initial encounter: Secondary | ICD-10-CM | POA: Diagnosis not present

## 2018-01-02 DIAGNOSIS — Y92009 Unspecified place in unspecified non-institutional (private) residence as the place of occurrence of the external cause: Secondary | ICD-10-CM | POA: Insufficient documentation

## 2018-01-02 DIAGNOSIS — I10 Essential (primary) hypertension: Secondary | ICD-10-CM | POA: Insufficient documentation

## 2018-01-02 MED ORDER — PROPOFOL 10 MG/ML IV BOLUS
INTRAVENOUS | Status: AC | PRN
  Administered 2018-01-02: 4 mg via INTRAVENOUS

## 2018-01-02 MED ORDER — SODIUM CHLORIDE 0.9 % IV BOLUS
1000.0000 mL | Freq: Once | INTRAVENOUS | Status: AC
  Administered 2018-01-02: 1000 mL via INTRAVENOUS

## 2018-01-02 MED ORDER — FENTANYL CITRATE (PF) 100 MCG/2ML IJ SOLN
50.0000 ug | Freq: Once | INTRAMUSCULAR | Status: AC
  Administered 2018-01-02: 50 ug via INTRAVENOUS
  Filled 2018-01-02: qty 2

## 2018-01-02 MED ORDER — OXYCODONE-ACETAMINOPHEN 5-325 MG PO TABS
1.0000 | ORAL_TABLET | Freq: Once | ORAL | Status: AC
Start: 2018-01-02 — End: 2018-01-02
  Administered 2018-01-02: 1 via ORAL
  Filled 2018-01-02: qty 1

## 2018-01-02 MED ORDER — OXYCODONE-ACETAMINOPHEN 5-325 MG PO TABS: 1.0000 | ORAL_TABLET | ORAL | 0 refills | Status: DC | PRN

## 2018-01-02 MED ORDER — PROPOFOL 10 MG/ML IV BOLUS
2.0000 mg/kg | Freq: Once | INTRAVENOUS | Status: AC
  Administered 2018-01-02: 8 mg via INTRAVENOUS
  Filled 2018-01-02: qty 20

## 2018-01-02 NOTE — ED Triage Notes (Signed)
Pt tripped while feeding cats.  Rt ankle deformity

## 2018-01-02 NOTE — ED Provider Notes (Signed)
[facility] EMERGENCY DEPARTMENT Provider Note   CSN: [id number] Arrival date & time: 01/02/18  2213     History   Chief Complaint Chief Complaint  Patient presents with  . Ankle Pain    HPI Janice Gonzales is a 62 y.o. female.   Ankle Pain   The incident occurred less than 1 hour ago. The incident occurred at home. The injury mechanism was a fall. The pain is present in the right ankle. The quality of the pain is described as aching and sharp. The pain is mild. The pain has been constant since onset. Associated symptoms include inability to bear weight. She reports no foreign bodies present. The symptoms are aggravated by activity and bearing weight.    Past Medical History:  Diagnosis Date  . Arthritis   . Glaucoma   . History of DVT of lower extremity    with PE  . Hyperlipidemia 07/05/2017  . Hypertension   . Post herpetic neuralgia 06/17/2017  . Sleep apnea     Patient Active Problem List   Diagnosis Date Noted  . Hyperlipidemia 07/05/2017  . Vitamin D deficiency 07/05/2017  . Essential hypertension 06/17/2017  . Post herpetic neuralgia 06/17/2017  . History of pulmonary embolism 06/17/2017    No past surgical history on file.   OB History   None      Home Medications    Prior to Admission medications   Medication Sig Start Date End Date Taking? Authorizing Provider  aspirin EC 81 MG tablet Take 81 mg by mouth daily.    [provider]  benzonatate (TESSALON) 100 MG capsule Take 1 capsule (100 mg total) 2 (two) times daily as needed by mouth for cough. 07/05/17   Eustace Moore, MD  gabapentin (NEURONTIN) 300 MG capsule Take 1 capsule (300 mg total) by mouth at bedtime. 06/17/17   Eustace Moore, MD  losartan (COZAAR) 100 MG tablet Take 100 mg by mouth daily. 06/13/17   [provider]    Family History Family History  Problem Relation Age of Onset  . Diabetes Mother   . Hyperlipidemia Mother   . Stroke Mother   .  Alcohol abuse Father   . Arthritis Father   . COPD Father   . Heart disease Father   . Hypertension Father   . Heart disease Maternal Grandmother        heart attack  . Cancer Paternal Grandmother        breast cancer    Social History Social History   Tobacco Use  . Smoking status: Never Smoker  . Smokeless tobacco: Never Used  Substance Use Topics  . Alcohol use: No  . Drug use: No     Allergies   Tetracyclines & related and Thimerosal   Review of Systems Review of Systems  All other systems reviewed and are negative.    Physical Exam Updated Vital Signs BP (!) 142/77   Pulse 90   Temp 98.9 F (37.2 C) (Oral)   Resp 19   Ht  (1.651 m)   Wt 86.2 kg (190 lb)   SpO2 99%   BMI 31.62 kg/m   Physical Exam  Constitutional: She is oriented to person, place, and time. She appears well-developed and well-nourished.  HENT:  Head: Normocephalic and atraumatic.  Neck: Normal range of motion.  Cardiovascular: Normal rate and regular rhythm.  Pulmonary/Chest: Effort normal and breath sounds normal. No stridor. No respiratory distress.  Abdominal: Soft. Bowel sounds  are normal. She exhibits no distension.  Musculoskeletal: She exhibits edema, tenderness (right ankle with obvious deformity and skin tenting from bone) and deformity.  Neurological: She is alert and oriented to person, place, and time. No cranial nerve deficit. Coordination normal.  Nursing note and vitals reviewed.    ED Treatments / Results  Labs (all labs ordered are listed, but only abnormal results are displayed) Labs Reviewed - No data to display  EKG None  Radiology No results found.  Procedures .Sedation Date/Time: 01/02/2018 11:41 PM Performed by: Marily Memos, MD Authorized by: Marily Memos, MD   Consent:    Consent obtained:  Verbal   Consent given by:  Patient   Risks discussed:  Allergic reaction, dysrhythmia, inadequate sedation, nausea, prolonged hypoxia resulting in  organ damage, prolonged sedation necessitating reversal, respiratory compromise necessitating ventilatory assistance and intubation and vomiting   Alternatives discussed:  Analgesia without sedation, anxiolysis and regional anesthesia Universal protocol:    Procedure explained and questions answered to patient or proxy's satisfaction: yes     Relevant documents present and verified: yes     Test results available and properly labeled: yes     Imaging studies available: yes     Required blood products, implants, devices, and special equipment available: yes     Site/side marked: yes     Immediately prior to procedure a time out was called: yes     Patient identity confirmation method:  Verbally with patient Indications:    Procedure necessitating sedation performed by:  Physician performing sedation   Intended level of sedation:  Deep Pre-sedation assessment:    Time since last food or drink:  2 hours   ASA classification: class 1 - normal, healthy patient     Neck mobility: normal     Mouth opening:  3 or more finger widths   Thyromental distance:  4 finger widths   Mallampati score:  I - soft palate, uvula, fauces, pillars visible   Pre-sedation assessments completed and reviewed: airway patency, cardiovascular function, hydration status, mental status, nausea/vomiting, pain level, respiratory function and temperature     Pre-sedation assessment completed:  01/02/2018 11:05 PM Immediate pre-procedure details:    Reassessment: Patient reassessed immediately prior to procedure     Reviewed: vital signs, relevant labs/tests and NPO status     Verified: bag valve mask available, emergency equipment available, intubation equipment available, IV patency confirmed, oxygen available and suction available   Procedure details (see MAR for exact dosages):    Preoxygenation:  Nasal cannula   Sedation:  Propofol   Intra-procedure monitoring:  Blood pressure monitoring, cardiac monitor, continuous  pulse oximetry, frequent LOC assessments, frequent vital sign checks and continuous capnometry   Intra-procedure events: none     Total Provider sedation time (minutes):  20 Post-procedure details:    Post-sedation assessment completed:  01/02/2018 11:43 PM   Attendance: Constant attendance by certified staff until patient recovered     Recovery: Patient returned to pre-procedure baseline     Post-sedation assessments completed and reviewed: airway patency, cardiovascular function, hydration status, mental status, nausea/vomiting, pain level, respiratory function and temperature     Patient is stable for discharge or admission: yes     Patient tolerance:  Tolerated well, no immediate complications .Splint Application Date/Time: 01/02/2018 11:43 PM Performed by: Marily Memos, MD Authorized by: Marily Memos, MD   Consent:    Consent obtained:  Verbal   Consent given by:  Patient   Risks discussed:  Discoloration, numbness  and pain   Alternatives discussed:  No treatment Pre-procedure details:    Sensation:  Normal Procedure details:    Laterality:  Right   Location:  Ankle   Ankle:  R ankle   Cast type:  Short leg   Splint type:  Ankle stirrup and short leg   Supplies:  Ortho-Glass Post-procedure details:    Pain:  Unchanged   Sensation:  Normal   Skin color:  Pink, good capillary refill   Patient tolerance of procedure:  Tolerated well, no immediate complications .Ortho Injury Treatment Date/Time: 01/02/2018 11:44 PM Performed by: Marily Memos, MD Authorized by: Marily Memos, MD   Consent:    Consent obtained:  Verbal   Consent given by:  Patient   Risks discussed:  Fracture, irreducible dislocation, recurrent dislocation, restricted joint movement and vascular damage   Alternatives discussed:  No treatmentInjury location: ankle Location details: right ankle Injury type: fracture-dislocation Fracture type: lateral malleolus Pre-procedure neurovascular assessment:  neurovascularly intact Pre-procedure distal perfusion: normal Pre-procedure neurological function: normal Pre-procedure range of motion: normal  Anesthesia: Local anesthesia used: no  Patient sedated: Yes. Refer to sedation procedure documentation for details of sedation. Manipulation performed: yes Skin traction used: no Skeletal traction used: no Reduction successful: no X-ray confirmed reduction: yes Immobilization: splint Splint type: ankle stirrup and short leg Supplies used: cotton padding,  elastic bandage and Ortho-Glass Post-procedure neurovascular assessment: post-procedure neurovascularly intact Post-procedure distal perfusion: normal Post-procedure neurological function: normal Post-procedure range of motion: improved Patient tolerance: Patient tolerated the procedure well with no immediate complications    (including critical care time)  Medications Ordered in ED Medications  sodium chloride 0.9 % bolus 1,000 mL (1,000 mLs Intravenous New Bag/Given 01/02/18 2300)  oxyCODONE-acetaminophen (PERCOCET/ROXICET) 5-325 MG per tablet 1 tablet (has no administration in time range)  propofol (DIPRIVAN) 10 mg/mL bolus/IV push 172.4 mg (8 mg Intravenous Given 01/02/18 2317)  fentaNYL (SUBLIMAZE) injection 50 mcg (50 mcg Intravenous Given 01/02/18 2259)  propofol (DIPRIVAN) 10 mg/mL bolus/IV push (4 mg Intravenous Given 01/02/18 2300)     Initial Impression / Assessment and Plan / ED Course  I have reviewed the triage vital signs and the nursing notes.  Pertinent labs & imaging results that were available during my care of the patient were reviewed by me and considered in my medical decision making (see chart for details).  Obvious ankle dislocation and possible fracture of her right ankle after mechanical fall.  Reduced during sedation as per above procedure notes.  Splint applied by myself and the nurse.  Discussed with Dr. Romeo Apple who will see her his urine on her in the  morning but states that if the reduction is successful and her pain is controlled she will be discharged home with outpatient follow-up and management.  Final Clinical Impressions(s) / ED Diagnoses   Final diagnoses:  Fall    ED Discharge Orders    None       Lamoine Magallon, Barbara Cower, MD 01/03/18 0030

## 2018-01-03 ENCOUNTER — Encounter (HOSPITAL_COMMUNITY)
Admission: RE | Admit: 2018-01-03 | Discharge: 2018-01-03 | Disposition: A | Discharge status: Home / Self Care | Payer: BLUE CROSS/BLUE SHIELD | Source: Ambulatory Visit | Attending: Orthopedic Surgery | Admitting: Orthopedic Surgery

## 2018-01-03 ENCOUNTER — Other Ambulatory Visit: Payer: Self-pay

## 2018-01-03 ENCOUNTER — Encounter (HOSPITAL_COMMUNITY): Payer: Self-pay

## 2018-01-03 ENCOUNTER — Ambulatory Visit: Payer: BLUE CROSS/BLUE SHIELD | Admitting: Orthopedic Surgery

## 2018-01-03 ENCOUNTER — Telehealth: Payer: Self-pay | Admitting: Radiology

## 2018-01-03 VITALS — BP 122/81 | HR 75 | Ht 65.0 in | Wt 190.0 lb

## 2018-01-03 DIAGNOSIS — S82851A Displaced trimalleolar fracture of right lower leg, initial encounter for closed fracture: Secondary | ICD-10-CM | POA: Diagnosis not present

## 2018-01-03 DIAGNOSIS — R112 Nausea with vomiting, unspecified: Secondary | ICD-10-CM

## 2018-01-03 DIAGNOSIS — Z01812 Encounter for preprocedural laboratory examination: Secondary | ICD-10-CM | POA: Diagnosis not present

## 2018-01-03 DIAGNOSIS — Z01810 Encounter for preprocedural cardiovascular examination: Secondary | ICD-10-CM | POA: Insufficient documentation

## 2018-01-03 LAB — BASIC METABOLIC PANEL
Anion gap: 9 (ref 5–15)
BUN: 14 mg/dL (ref 6–20)
CO2: 28 mmol/L (ref 22–32)
CREATININE: 0.82 mg/dL (ref 0.44–1.00)
Calcium: 9.1 mg/dL (ref 8.9–10.3)
Chloride: 100 mmol/L — ABNORMAL LOW (ref 101–111)
GFR calc non Af Amer: >60 mL/min (ref >60)
Glucose, Bld: 108 mg/dL — ABNORMAL HIGH (ref 65–99)
Potassium: 3.9 mmol/L (ref 3.5–5.1)
SODIUM: 137 mmol/L (ref 135–145)

## 2018-01-03 LAB — CBC WITH DIFFERENTIAL/PLATELET
Basophils Absolute: 0 10*3/uL (ref 0.0–0.1)
Basophils Relative: 0 %
EOS ABS: 0.1 10*3/uL (ref 0.0–0.7)
Eosinophils Relative: 1 %
HEMATOCRIT: 38.3 % (ref 36.0–46.0)
HEMOGLOBIN: 12.1 g/dL (ref 12.0–15.0)
LYMPHS ABS: 2.1 10*3/uL (ref 0.7–4.0)
LYMPHS PCT: 14 %
MCH: 27.4 pg (ref 26.0–34.0)
MCHC: 31.6 g/dL (ref 30.0–36.0)
MCV: 86.8 fL (ref 78.0–100.0)
MONOS PCT: 7 %
Monocytes Absolute: 1 10*3/uL (ref 0.1–1.0)
NEUTROS PCT: 78 %
Neutro Abs: 11.3 10*3/uL — ABNORMAL HIGH (ref 1.7–7.7)
Platelets: 194 10*3/uL (ref 150–400)
RBC: 4.41 MIL/uL (ref 3.87–5.11)
RDW: 14 % (ref 11.5–15.5)
WBC: 14.5 10*3/uL — ABNORMAL HIGH (ref 4.0–10.5)

## 2018-01-03 MED ORDER — PROMETHAZINE HCL 12.5 MG PO TABS: 12.5000 mg | ORAL_TABLET | Freq: Four times a day (QID) | ORAL | 0 refills | Status: DC | PRN

## 2018-01-03 NOTE — Telephone Encounter (Signed)
I called Janice Gonzales today and no auth required for the surgery ORIF ankle CPT code 40981  Ref number for the call is [id number]

## 2018-01-03 NOTE — Patient Instructions (Signed)
Janice Gonzales  01/03/2018     @   Your procedure is scheduled on 01/06/2018  Report to [facility] at 1030 A.M.  Call this number if you have problems the morning of surgery:  [phone]   Remember: NOTHING TO EAT OR DRINK AFTER MIDNIGHT      Take these medicines the morning of surgery with A SIP OF WATER Tessalon if needed, Gabapentin, Losartan, Oxycodone if needed, Phenergan if needed    Do not wear jewelry, make-up or nail polish.  Do not wear lotions, powders, or perfumes, or deodorant.  Do not shave 48 hours prior to surgery.  Men may shave face and neck.  Do not bring valuables to the hospital.  [facility] is not responsible for any belongings or valuables.  Contacts, dentures or bridgework may not be worn into surgery.  Leave your suitcase in the car.  After surgery it may be brought to your room.  For patients admitted to the hospital, discharge time will be determined by your treatment team.  Patients discharged the day of surgery will not be allowed to drive home.    Please read over the following fact sheets that you were given. Surgical Site Infection Prevention and Anesthesia Post-op Instructions     PATIENT INSTRUCTIONS POST-ANESTHESIA  IMMEDIATELY FOLLOWING SURGERY:  Do not drive or operate machinery for the first twenty four hours after surgery.  Do not make any important decisions for twenty four hours after surgery or while taking narcotic pain medications or sedatives.  If you develop intractable nausea and vomiting or a severe headache please notify your doctor immediately.  FOLLOW-UP:  Please make an appointment with your surgeon as instructed. You do not need to follow up with anesthesia unless specifically instructed to do so.  WOUND CARE INSTRUCTIONS (if applicable):  Keep a dry clean dressing on the anesthesia/puncture wound site if there is drainage.  Once the wound has quit draining you may leave it open to air.  Generally  you should leave the bandage intact for twenty four hours unless there is drainage.  If the epidural site drains for more than 36-48 hours please call the anesthesia department.  QUESTIONS?:  Please feel free to call your physician or the hospital operator if you have any questions, and they will be happy to assist you.      Displaced Bimalleolar Ankle Fracture Treated With ORIF A bimalleolar fracture is two breaks (fractures) in the lower bones of the leg that help to make up the ankle. These fractures are in the far ends of the bone that you feel as the bump on the outside of the ankle (fibula) and the bone that you feel as the bump on the inside of the ankle (tibia). The fracture is displaced. This means that the bones are not lined up correctly. The bones will be put back into position with a procedure that is called open reduction with internal fixation (ORIF). A combination of screws, screws and a metal plate, or different types of wiring will be used to hold the bones in place. Tell a health care provider about:  Any allergies you have.  All medicines you are taking, including vitamins, herbs, eye drops, creams, and over-the-counter medicines.  Any problems you or family members have had with anesthetic medicines.  Any blood disorders you have.  Any surgeries you have had.  Any medical conditions you have, including the possibility of pregnancy. What are the risks? Generally, this is a  safe procedure. However, problems may occur, including:  Excessive bleeding.  Infection.  Failure of the fracture to heal.  Long-term pain and stiffness (arthritis).  Stiffness of the ankle after the repair.  What happens before the procedure?  Ask your health care provider about: ? Changing or stopping your regular medicines. This is especially important if you are taking diabetes medicines or blood thinners. ? Taking medicines such as aspirin and ibuprofen. These medicines can thin your  blood. Do not take these medicines before your procedure if your health care provider instructs you not to.  Follow instructions from your health care provider about eating or drinking restrictions.  Plan to have someone take you home after the procedure.  If you go home right after the procedure, plan to have someone with you for 24 hours.  Ask your health care provider how your surgical site will be marked or identified.  You may be given antibiotic medicine to help prevent infection. What happens during the procedure?  To reduce your risk of infection: ? Your health care team will wash or sanitize their hands. ? Your skin will be washed with soap.  An IV tube will be inserted into one of your veins. Medicine will flow directly into your body through this tube. You may be given antibiotic medicine and medicine for pain through the IV tube.  You will be given one or more of the following: ? A medicine that numbs the area (local anesthetic). ? A medicine that makes you fall asleep (general anesthetic). ? A medicine that is injected into your spine that numbs the area below and slightly above the injection site (spinal anesthetic). ? A medicine that is injected into an area of your body that numbs everything below the injection site (regional anesthetic).  The surgeon will make an incision through your skin to expose the areas of the fracture.  The broken bones will be put back into their normal positions. The surgeon will use a combination of screws, screws and a metal plate, or different types of wiring to hold the bones in place.  After the bones are back in place, the surgeon will close the incision using stitches (sutures) or staples.  A bandage (dressing) and a cast, splint, or supportive boot will be placed over your ankle. The procedure may vary among health care providers and hospitals. What happens after the procedure?  Your blood pressure, heart rate, breathing rate, and  blood oxygen level will be monitored often until the medicines you were given have worn off.  You may be given medicine to control the pain. This information is not intended to replace advice given to you by your health care provider. Make sure you discuss any questions you have with your health care provider. Document Released: 05/16/2005 Document Revised: 01/12/2016 Document Reviewed: 06/30/2014 Elsevier Interactive Patient Education  [employer].

## 2018-01-03 NOTE — Progress Notes (Signed)
Patient ID: KAZI MONTORO, female   DOB: [birth year], 62 y.o.   MRN: [id number]  Chief Complaint  Patient presents with  . Ankle Injury    ER follow up ankle fracture, DOI 01-02-18.    HPI CARLOTTA TELFAIR is a 62 y.o. female.   62 year old female fell at her home down the steps on the 16th went to the ER for closed reduction laced in a splint follow-up in the office  Complains of dull pain mild pain 1 day associated with swelling and stiffness   Review of Systems Review of Systems  All other systems reviewed and are negative.  Joint pain right shoulder and right knee Nausea from medication Percocet   Past Medical History:  Diagnosis Date  . Arthritis   . Glaucoma   . History of DVT of lower extremity    with PE  . Hyperlipidemia 07/05/2017  . Hypertension   . Post herpetic neuralgia 06/17/2017  . Sleep apnea     No past surgical history on file.  Family History  Problem Relation Age of Onset  . Diabetes Mother   . Hyperlipidemia Mother   . Stroke Mother   . Alcohol abuse Father   . Arthritis Father   . COPD Father   . Heart disease Father   . Hypertension Father   . Heart disease Maternal Grandmother        heart attack  . Cancer Paternal Grandmother        breast cancer    Social History Social History   Tobacco Use  . Smoking status: Never Smoker  . Smokeless tobacco: Never Used  Substance Use Topics  . Alcohol use: No  . Drug use: No    Allergies  Allergen Reactions  . Tetracyclines & Related Itching  . Thimerosal Itching    Current Outpatient Medications  Medication Sig Dispense Refill  . aspirin EC 81 MG tablet Take 81 mg by mouth daily.    Marland Kitchen losartan (COZAAR) 100 MG tablet Take 100 mg by mouth daily.  1  . oxyCODONE-acetaminophen (PERCOCET) 5-325 MG tablet Take 1-2 tablets by mouth every 4 (four) hours as needed for severe pain. 20 tablet 0  . benzonatate (TESSALON) 100 MG capsule Take 1 capsule (100 mg total) 2 (two) times daily as  needed by mouth for cough. (Patient not taking: Reported on 01/03/2018) 20 capsule 0  . gabapentin (NEURONTIN) 300 MG capsule Take 1 capsule (300 mg total) by mouth at bedtime. (Patient not taking: Reported on 01/03/2018) 30 capsule 2  . oxyCODONE-acetaminophen (PERCOCET/ROXICET) 5-325 MG tablet Take 1-2 tablets by mouth every 4 (four) hours as needed for severe pain. (Patient not taking: Reported on 01/03/2018) 6 tablet 0  . promethazine (PHENERGAN) 12.5 MG tablet Take 1 tablet (12.5 mg total) by mouth every 6 (six) hours as needed for nausea or vomiting. 30 tablet 0   No current facility-administered medications for this visit.        Physical Exam BP 122/81   Pulse 75   Ht  (1.651 m)   Wt 190 lb (86.2 kg)   BMI 31.62 kg/m   Physical Exam  General appearance is normal  She is oriented x3  Mood is pleasant affect is flat Ambulatory status using crutches and wheelchair cannot bear weight right leg Ortho Exam  Right upper extremity inspection normal palpation crepitance in the right shoulder passive range of motion painful after 90 degrees but otherwise full stability normal strength normal skin normal pulse  normal sensation normal lymph nodes negative in the axilla  Left upper extremity inspection no deformity or tenderness full range of motion recorded shoulder stable muscle strength and tone normal skin is intact pulses are excellent lymph nodes are negative in the axilla sensation normal  Left lower extremity normal alignment full range of motion ligament stable in the knee hip and ankle strength muscle tone normal in the entire leg skin normal throughout the extremity pulse temperature edema normal pulses normal temperature no edema sensation normal  Right ankle normal reasonable alignment swelling skin looks normal painful range of motion limited by pain ankle unstable to pronation external rotation stable and inversion muscle tone normal skin looks good as stated pulse and  temperature normal no edema sensation is normal throughout the foot  Deep tendon reflexes are normal pathologic reflexes none  Coordination balance are poor lower extremity secondary to the fracture pain and recent trauma    MEDICAL DECISION SECTION  xrays ordered? NO  My independent reading of xrays: 1 set of x-rays are seen from the emergency room on May 16.  Closed reduction was performed.  Medial malleolus fracture fairly high fibular fracture suggesting syndesmosis injury with widening of the tib-fib clear space at the joint line there is a hint that there may be a small posterior malleolar avulsion fracture, looks like pronation external rotation injury   Encounter Diagnoses  Name Primary?  . Closed trimalleolar fracture of right ankle, initial encounter Yes  . Nausea and vomiting, intractability of vomiting not specified, unspecified vomiting type      PLAN:   Meds ordered this encounter  Medications  . promethazine (PHENERGAN) 12.5 MG tablet    Sig: Take 1 tablet (12.5 mg total) by mouth every 6 (six) hours as needed for nausea or vomiting.    Dispense:  30 tablet    Refill:  0    Patient advised ice and elevate over the weekend made aware that surgery will be postponed if there is blistering  I gave her some medicine for nausea  Plan is for open treatment internal fixation right ankle  Out of work 12 weeks  No weightbearing  The procedure has been fully reviewed with the patient; The risks and benefits of surgery have been discussed and explained and understood. Alternative treatment has also been reviewed, questions were encouraged and answered. The postoperative plan is also been reviewed.

## 2018-01-03 NOTE — Patient Instructions (Signed)
Ankle Fracture A fracture is a break in a bone. A cast or splint may be used to protect the ankle and heal the break. Sometimes, surgery is needed. Follow these instructions at home:  Use crutches as told by your doctor. It is very important that you use your crutches correctly.  Do not put weight or pressure on the injured ankle until told by your doctor.  Keep your ankle raised (elevated) when sitting or lying down.  Apply ice to the ankle: ? Put ice in a plastic bag. ? Place a towel between your cast and the bag. ? Leave the ice on for 20 minutes, 2-3 times a day.  If you have a plaster or fiberglass cast: ? Do not try to scratch under the cast with any objects. ? Check the skin around the cast every day. You may put lotion on red or sore areas. ? Keep your cast dry and clean.  If you have a plaster splint: ? Wear the splint as told by your doctor. ? You can loosen the elastic around the splint if your toes get numb, tingle, or turn cold or blue.  Do not put pressure on any part of your cast or splint. It may break. Rest your plaster splint or cast only on a pillow the first 24 hours until it is fully hardened.  Cover your cast or splint with a plastic bag during showers.  Do not lower your cast or splint into water.  Take medicine as told by your doctor.  Do not drive until your doctor says it is safe.  Follow-up with your doctor as told. It is very important that you go to your follow-up visits. Contact a doctor if: The swelling and discomfort gets worse. Get help right away if:  Your splint or cast breaks.  You continue to have very bad pain.  You have new pain or swelling after your splint or cast was put on.  Your skin or toes below the injured ankle: ? Turn blue or gray. ? Feel cold, numb, or you cannot feel them.  There is a bad smell or yellowish white fluid (pus) coming from under the splint or cast. This information is not intended to replace advice  given to you by your health care provider. Make sure you discuss any questions you have with your health care provider. Document Released: 06/03/2009 Document Revised: 01/12/2016 Document Reviewed: 03/05/2013 Elsevier Interactive Patient Education  2017 [employer]  

## 2018-01-06 ENCOUNTER — Encounter (HOSPITAL_COMMUNITY)
Admission: RE | Disposition: A | Discharge status: Home / Self Care | Payer: Self-pay | Source: Ambulatory Visit | Attending: Orthopedic Surgery

## 2018-01-06 ENCOUNTER — Ambulatory Visit (HOSPITAL_COMMUNITY): Payer: BLUE CROSS/BLUE SHIELD | Admitting: Anesthesiology

## 2018-01-06 ENCOUNTER — Ambulatory Visit (HOSPITAL_COMMUNITY): Payer: BLUE CROSS/BLUE SHIELD

## 2018-01-06 ENCOUNTER — Ambulatory Visit (HOSPITAL_COMMUNITY)
Admission: RE | Admit: 2018-01-06 | Discharge: 2018-01-06 | Disposition: A | Discharge status: Home / Self Care | Payer: BLUE CROSS/BLUE SHIELD | Source: Ambulatory Visit | Attending: Orthopedic Surgery | Admitting: Orthopedic Surgery

## 2018-01-06 ENCOUNTER — Encounter (HOSPITAL_COMMUNITY): Payer: Self-pay | Admitting: *Deleted

## 2018-01-06 DIAGNOSIS — Z8669 Personal history of other diseases of the nervous system and sense organs: Secondary | ICD-10-CM | POA: Insufficient documentation

## 2018-01-06 DIAGNOSIS — Z833 Family history of diabetes mellitus: Secondary | ICD-10-CM | POA: Insufficient documentation

## 2018-01-06 DIAGNOSIS — Z881 Allergy status to other antibiotic agents status: Secondary | ICD-10-CM | POA: Diagnosis not present

## 2018-01-06 DIAGNOSIS — Z823 Family history of stroke: Secondary | ICD-10-CM | POA: Insufficient documentation

## 2018-01-06 DIAGNOSIS — S82851A Displaced trimalleolar fracture of right lower leg, initial encounter for closed fracture: Secondary | ICD-10-CM

## 2018-01-06 DIAGNOSIS — M1990 Unspecified osteoarthritis, unspecified site: Secondary | ICD-10-CM | POA: Insufficient documentation

## 2018-01-06 DIAGNOSIS — Z825 Family history of asthma and other chronic lower respiratory diseases: Secondary | ICD-10-CM | POA: Insufficient documentation

## 2018-01-06 DIAGNOSIS — Z86718 Personal history of other venous thrombosis and embolism: Secondary | ICD-10-CM | POA: Insufficient documentation

## 2018-01-06 DIAGNOSIS — Z803 Family history of malignant neoplasm of breast: Secondary | ICD-10-CM | POA: Diagnosis not present

## 2018-01-06 DIAGNOSIS — Z8249 Family history of ischemic heart disease and other diseases of the circulatory system: Secondary | ICD-10-CM | POA: Diagnosis not present

## 2018-01-06 DIAGNOSIS — I10 Essential (primary) hypertension: Secondary | ICD-10-CM | POA: Insufficient documentation

## 2018-01-06 DIAGNOSIS — Z811 Family history of alcohol abuse and dependence: Secondary | ICD-10-CM | POA: Diagnosis not present

## 2018-01-06 DIAGNOSIS — S82891A Other fracture of right lower leg, initial encounter for closed fracture: Secondary | ICD-10-CM

## 2018-01-06 DIAGNOSIS — E785 Hyperlipidemia, unspecified: Secondary | ICD-10-CM | POA: Insufficient documentation

## 2018-01-06 DIAGNOSIS — S82851D Displaced trimalleolar fracture of right lower leg, subsequent encounter for closed fracture with routine healing: Secondary | ICD-10-CM

## 2018-01-06 DIAGNOSIS — Z888 Allergy status to other drugs, medicaments and biological substances status: Secondary | ICD-10-CM | POA: Insufficient documentation

## 2018-01-06 DIAGNOSIS — H409 Unspecified glaucoma: Secondary | ICD-10-CM | POA: Diagnosis not present

## 2018-01-06 DIAGNOSIS — Z7982 Long term (current) use of aspirin: Secondary | ICD-10-CM | POA: Diagnosis not present

## 2018-01-06 DIAGNOSIS — G4730 Sleep apnea, unspecified: Secondary | ICD-10-CM | POA: Diagnosis not present

## 2018-01-06 DIAGNOSIS — W109XXA Fall (on) (from) unspecified stairs and steps, initial encounter: Secondary | ICD-10-CM | POA: Insufficient documentation

## 2018-01-06 DIAGNOSIS — Z8261 Family history of arthritis: Secondary | ICD-10-CM | POA: Insufficient documentation

## 2018-01-06 DIAGNOSIS — R112 Nausea with vomiting, unspecified: Secondary | ICD-10-CM

## 2018-01-06 HISTORY — PX: ORIF ANKLE FRACTURE: SHX5408

## 2018-01-06 SURGERY — OPEN REDUCTION INTERNAL FIXATION (ORIF) ANKLE FRACTURE
Anesthesia: Regional | Laterality: Right

## 2018-01-06 MED ORDER — HYDROCODONE-ACETAMINOPHEN 7.5-325 MG PO TABS
1.0000 | ORAL_TABLET | Freq: Once | ORAL | Status: AC
  Administered 2018-01-06: 1 via ORAL
  Filled 2018-01-06: qty 1

## 2018-01-06 MED ORDER — OXYCODONE-ACETAMINOPHEN 5-325 MG PO TABS: 1.0000 | ORAL_TABLET | ORAL | 0 refills | Status: DC | PRN

## 2018-01-06 MED ORDER — DEXAMETHASONE SODIUM PHOSPHATE 4 MG/ML IJ SOLN
INTRAMUSCULAR | Status: AC
  Filled 2018-01-06: qty 2

## 2018-01-06 MED ORDER — LACTATED RINGERS IV SOLN
INTRAVENOUS | Status: DC
  Administered 2018-01-06: 12:00:00 via INTRAVENOUS

## 2018-01-06 MED ORDER — MIDAZOLAM HCL 5 MG/5ML IJ SOLN
INTRAMUSCULAR | Status: DC | PRN
  Administered 2018-01-06 (×2): 2 mg via INTRAVENOUS

## 2018-01-06 MED ORDER — HYDROCODONE-ACETAMINOPHEN 7.5-325 MG PO TABS: 1.0000 | ORAL_TABLET | Freq: Once | ORAL | Status: DC | PRN

## 2018-01-06 MED ORDER — ONDANSETRON HCL 4 MG/2ML IJ SOLN
INTRAMUSCULAR | Status: AC
  Filled 2018-01-06: qty 2

## 2018-01-06 MED ORDER — MEPERIDINE HCL 50 MG/ML IJ SOLN: 6.2500 mg | INTRAMUSCULAR | Status: DC | PRN

## 2018-01-06 MED ORDER — ROPIVACAINE HCL 5 MG/ML IJ SOLN
INTRAMUSCULAR | Status: DC | PRN
  Administered 2018-01-06 (×2): 15 mL via EPIDURAL

## 2018-01-06 MED ORDER — CHLORHEXIDINE GLUCONATE 4 % EX LIQD: 60.0000 mL | Freq: Once | CUTANEOUS | Status: DC

## 2018-01-06 MED ORDER — PROMETHAZINE HCL 12.5 MG PO TABS: 12.5000 mg | ORAL_TABLET | Freq: Four times a day (QID) | ORAL | 0 refills | Status: DC | PRN

## 2018-01-06 MED ORDER — HYDROMORPHONE HCL 1 MG/ML IJ SOLN: 0.2500 mg | INTRAMUSCULAR | Status: DC | PRN

## 2018-01-06 MED ORDER — PROPOFOL 10 MG/ML IV BOLUS
INTRAVENOUS | Status: AC
  Filled 2018-01-06: qty 20

## 2018-01-06 MED ORDER — ONDANSETRON HCL 4 MG/2ML IJ SOLN: 4.0000 mg | Freq: Once | INTRAMUSCULAR | Status: DC | PRN

## 2018-01-06 MED ORDER — KETOROLAC TROMETHAMINE 30 MG/ML IJ SOLN: 30.0000 mg | Freq: Once | INTRAMUSCULAR | Status: DC | PRN

## 2018-01-06 MED ORDER — MIDAZOLAM HCL 2 MG/2ML IJ SOLN
INTRAMUSCULAR | Status: AC
  Filled 2018-01-06: qty 4

## 2018-01-06 MED ORDER — FENTANYL CITRATE (PF) 250 MCG/5ML IJ SOLN
INTRAMUSCULAR | Status: AC
  Filled 2018-01-06: qty 5

## 2018-01-06 MED ORDER — FENTANYL CITRATE (PF) 100 MCG/2ML IJ SOLN
INTRAMUSCULAR | Status: DC | PRN
  Administered 2018-01-06 (×3): 50 ug via INTRAVENOUS
  Administered 2018-01-06: 100 ug via INTRAVENOUS

## 2018-01-06 MED ORDER — DEXAMETHASONE SODIUM PHOSPHATE 10 MG/ML IJ SOLN
INTRAMUSCULAR | Status: DC | PRN
  Administered 2018-01-06 (×2): 4 mg via INTRAVENOUS

## 2018-01-06 MED ORDER — SODIUM CHLORIDE 0.9 % IJ SOLN
INTRAMUSCULAR | Status: AC
  Filled 2018-01-06: qty 20

## 2018-01-06 MED ORDER — ONDANSETRON HCL 4 MG/2ML IJ SOLN
INTRAMUSCULAR | Status: DC | PRN
  Administered 2018-01-06: 4 mg via INTRAVENOUS

## 2018-01-06 MED ORDER — ACETAMINOPHEN 500 MG PO TABS
500.0000 mg | ORAL_TABLET | Freq: Once | ORAL | Status: AC
  Administered 2018-01-06: 500 mg via ORAL
  Filled 2018-01-06: qty 1

## 2018-01-06 MED ORDER — CEFAZOLIN SODIUM-DEXTROSE 2-4 GM/100ML-% IV SOLN
2.0000 g | INTRAVENOUS | Status: AC
  Administered 2018-01-06: 2 g via INTRAVENOUS
  Filled 2018-01-06: qty 100

## 2018-01-06 MED ORDER — BUPIVACAINE-EPINEPHRINE (PF) 0.5% -1:200000 IJ SOLN
INTRAMUSCULAR | Status: AC
  Filled 2018-01-06: qty 60

## 2018-01-06 MED ORDER — ONDANSETRON HCL 4 MG/2ML IJ SOLN
4.0000 mg | Freq: Once | INTRAMUSCULAR | Status: AC
  Administered 2018-01-06: 4 mg via INTRAVENOUS
  Filled 2018-01-06: qty 2

## 2018-01-06 MED ORDER — POVIDONE-IODINE 10 % EX SWAB: 2.0000 "application " | Freq: Once | CUTANEOUS | Status: DC

## 2018-01-06 MED ORDER — 0.9 % SODIUM CHLORIDE (POUR BTL) OPTIME
TOPICAL | Status: DC | PRN
  Administered 2018-01-06: 1000 mL

## 2018-01-06 MED ORDER — ROPIVACAINE HCL 5 MG/ML IJ SOLN
INTRAMUSCULAR | Status: AC
  Filled 2018-01-06: qty 30

## 2018-01-06 MED FILL — Oxycodone w/ Acetaminophen Tab 5-325 MG: ORAL | Qty: 6 | Status: AC

## 2018-01-06 SURGICAL SUPPLY — 60 items
BANDAGE ELASTIC 3 LF NS (GAUZE/BANDAGES/DRESSINGS) ×2 IMPLANT
BANDAGE ELASTIC 4 LF NS (GAUZE/BANDAGES/DRESSINGS) ×4 IMPLANT
BANDAGE ESMARK 4X12 BL STRL LF (DISPOSABLE) ×1 IMPLANT
BIT DRILL 3.5X122MM AO FIT (BIT) ×2 IMPLANT
BIT DRILL CANN 2.7 (BIT) ×1
BIT DRILL SRG 2.7XCANN AO CPLG (BIT) ×1 IMPLANT
BIT DRL SRG 2.7XCANN AO CPLNG (BIT) ×1
BLADE SURG SZ10 CARB STEEL (BLADE) ×2 IMPLANT
BNDG COHESIVE 4X5 TAN STRL (GAUZE/BANDAGES/DRESSINGS) ×2 IMPLANT
BNDG ESMARK 4X12 BLUE STRL LF (DISPOSABLE) ×2
CHLORAPREP W/TINT 26ML (MISCELLANEOUS) ×4 IMPLANT
CLOTH BEACON ORANGE TIMEOUT ST (SAFETY) ×2 IMPLANT
COVER LIGHT HANDLE STERIS (MISCELLANEOUS) ×4 IMPLANT
CUFF TOURNIQUET SINGLE 34IN LL (TOURNIQUET CUFF) ×2 IMPLANT
DECANTER SPIKE VIAL GLASS SM (MISCELLANEOUS) ×4 IMPLANT
DRAPE C-ARM FOLDED MOBILE STRL (DRAPES) ×2 IMPLANT
DRAPE PROXIMA HALF (DRAPES) ×2 IMPLANT
DRILL 2.6X122MM WL AO SHAFT (BIT) ×2 IMPLANT
GAUZE SPONGE 4X4 12PLY STRL (GAUZE/BANDAGES/DRESSINGS) ×2 IMPLANT
GAUZE XEROFORM 5X9 LF (GAUZE/BANDAGES/DRESSINGS) ×2 IMPLANT
GLOVE BIOGEL M 7.0 STRL (GLOVE) ×4 IMPLANT
GLOVE BIOGEL PI IND STRL 7.0 (GLOVE) ×2 IMPLANT
GLOVE BIOGEL PI INDICATOR 7.0 (GLOVE) ×2
GLOVE ECLIPSE 6.5 STRL STRAW (GLOVE) ×2 IMPLANT
GLOVE SKINSENSE NS SZ8.0 LF (GLOVE) ×1
GLOVE SKINSENSE STRL SZ8.0 LF (GLOVE) ×1 IMPLANT
GLOVE SS N UNI LF 8.5 STRL (GLOVE) ×2 IMPLANT
GOWN STRL REUS W/TWL LRG LVL3 (GOWN DISPOSABLE) ×6 IMPLANT
GOWN STRL REUS W/TWL XL LVL3 (GOWN DISPOSABLE) ×2 IMPLANT
INST SET MINOR BONE (KITS) ×2 IMPLANT
K-WIRE ORTHOPEDIC 1.4X150L (WIRE) ×4
KIT TURNOVER KIT A (KITS) ×2 IMPLANT
KWIRE ORTHOPEDIC 1.4X150L (WIRE) ×2 IMPLANT
Kwire ×4 IMPLANT
MANIFOLD NEPTUNE II (INSTRUMENTS) ×2 IMPLANT
NEEDLE HYPO 21X1.5 SAFETY (NEEDLE) ×2 IMPLANT
NS IRRIG 1000ML POUR BTL (IV SOLUTION) ×2 IMPLANT
PACK BASIC LIMB (CUSTOM PROCEDURE TRAY) ×2 IMPLANT
PAD ABD 5X9 TENDERSORB (GAUZE/BANDAGES/DRESSINGS) ×4 IMPLANT
PAD ARMBOARD 7.5X6 YLW CONV (MISCELLANEOUS) ×2 IMPLANT
PAD CAST 4YDX4 CTTN HI CHSV (CAST SUPPLIES) ×1 IMPLANT
PADDING CAST COTTON 4X4 STRL (CAST SUPPLIES) ×1
PLATE 7H 96MM (Plate) ×2 IMPLANT
SCREW 46X4.0MM (Screw) ×2 IMPLANT
SCREW BONE 14MMX3.5MM (Screw) ×4 IMPLANT
SCREW BONE 3.5X16MM (Screw) ×2 IMPLANT
SCREW BONE NON-LCKING 3.5X12MM (Screw) ×4 IMPLANT
SCREW FT 46X4.0MM (Screw) ×2 IMPLANT
SCREW LOCK 20MMX3.5 (Screw) ×2 IMPLANT
SCREW LOCK 3.5X10MM (Screw) ×2 IMPLANT
SET BASIN LINEN APH (SET/KITS/TRAYS/PACK) ×2 IMPLANT
SPLINT J IMMOBILIZER 4X20FT (CAST SUPPLIES) ×1 IMPLANT
SPLINT J PLASTER J 4INX20Y (CAST SUPPLIES) ×1
SPONGE LAP 18X18 X RAY DECT (DISPOSABLE) ×2 IMPLANT
STAPLER VISISTAT 35W (STAPLE) ×2 IMPLANT
SUT ETHILON 3 0 FSL (SUTURE) ×2 IMPLANT
SUT MON AB 0 CT1 (SUTURE) ×2 IMPLANT
SUT MON AB 2-0 CT1 36 (SUTURE) ×2 IMPLANT
SYR 30ML LL (SYRINGE) ×2 IMPLANT
SYR BULB IRRIGATION 50ML (SYRINGE) ×2 IMPLANT

## 2018-01-06 NOTE — Brief Op Note (Signed)
01/06/2018  2:49 PM  PATIENT:  Janice Gonzales  62 y.o. female  PRE-OPERATIVE DIAGNOSIS:  Unstable trimalleolar right ankle fracture dislocation  POST-OPERATIVE DIAGNOSIS:  Unstable trimalleolar right ankle fracture dislocation  PROCEDURE:  Procedure(s): OPEN REDUCTION INTERNAL FIXATION (ORIF) RIGHT ANKLE FRACTURE (Right) - 27822  Implants [employer] ankle solutions 7 hole lateral plate with 6 cortical screws nonlocking and one interfrag screw cortical nonlocking and 2 medial screws one partially threaded 46 mm screw and 1 fully threaded 46 mm screw  Findings at surgery:  trimalleolar fracture pronation external rotation type injury with no syndesmosis disruption seen visually or with stress test  Assisted by [place] SURGEON:  Surgeon(s) and Role:    Vickki Hearing, MD - Primary   ANESTHESIA:   general and we did a popliteal block had a abductor block by anesthesia preop for postop anesthesia    EBL:  25 mL   BLOOD ADMINISTERED:none  DRAINS: none   LOCAL MEDICATIONS USED:  NONE  SPECIMEN:  No Specimen  DISPOSITION OF SPECIMEN:  N/A  COUNTS:  YES  TOURNIQUET:   Total Tourniquet Time Documented: Thigh (Right) - 59 minutes Total: Thigh (Right) - 59 minutes   DICTATION: .Reubin Milan Dictation  PLAN OF CARE: Discharge to home after PACU  PATIENT DISPOSITION:  PACU - hemodynamically stable.   Delay start of Pharmacological VTE agent (>24hrs) due to surgical blood loss or risk of bleeding: not applicable

## 2018-01-06 NOTE — Anesthesia Procedure Notes (Addendum)
Anesthesia Regional Block: Adductor canal block   Pre-Anesthetic Checklist: ,, timeout performed, Correct Patient, Correct Site, Correct Laterality, Correct Procedure, Correct Position, site marked, Risks and benefits discussed, at surgeon's request and post-op pain management  Laterality: Right  Prep: chloraprep       Needles:  Injection technique: Single-shot  Needle Type: Stimulator Needle - 80     Needle Length: 10cm  Needle Gauge: 22     Additional Needles:   Procedures:,,,, ultrasound used (permanent image in chart),,,,  Narrative:  Start time: 01/06/2018 12:20 PM End time: 01/06/2018 12:25 PM Injection made incrementally with aspirations every 5 mL.  Performed by: Personally  Anesthesiologist: Shona Needles, MD  Additional Notes: BP cuff, EKG monitors applied. Sedation begun (see popliteal nerve note). Ultrasound used for needle placement. After adductor canal located, anesthetic injected incrementally, slowly , and after neg aspirations. Tolerated well. 30cc with  Ropivicaine with  decadron

## 2018-01-06 NOTE — H&P (Signed)
Office preop history and physical   Patient ID: Janice Gonzales, female   DOB: 12-09-1955, 62 y.o.   MRN: [id number]       Chief Complaint  Patient presents with  . Ankle Injury      ER follow up ankle fracture, DOI 01-02-18.      HPI Janice Gonzales is a 62 y.o. female.   62 year old female fell at her home down the steps on the 16th went to the ER for closed reduction laced in a splint follow-up in the office   Complains of dull pain mild pain 1 day associated with swelling and stiffness     Review of Systems Review of Systems  All other systems reviewed and are negative.   Joint pain right shoulder and right knee Nausea from medication Percocet         Past Medical History:  Diagnosis Date  . Arthritis    . Glaucoma    . History of DVT of lower extremity      with PE  . Hyperlipidemia 07/05/2017  . Hypertension    . Post herpetic neuralgia 06/17/2017  . Sleep apnea        No past surgical history on file.        Family History  Problem Relation Age of Onset  . Diabetes Mother    . Hyperlipidemia Mother    . Stroke Mother    . Alcohol abuse Father    . Arthritis Father    . COPD Father    . Heart disease Father    . Hypertension Father    . Heart disease Maternal Grandmother          heart attack  . Cancer Paternal Grandmother          breast cancer      Social History Social History        Tobacco Use  . Smoking status: Never Smoker  . Smokeless tobacco: Never Used  Substance Use Topics  . Alcohol use: No  . Drug use: No          Allergies  Allergen Reactions  . Tetracyclines & Related Itching  . Thimerosal Itching            Current Outpatient Medications  Medication Sig Dispense Refill  . aspirin EC 81 MG tablet Take 81 mg by mouth daily.      Marland Kitchen losartan (COZAAR) 100 MG tablet Take 100 mg by mouth daily.   1  . oxyCODONE-acetaminophen (PERCOCET) 5-325 MG tablet Take 1-2 tablets by mouth every 4 (four) hours as needed for severe  pain. 20 tablet 0  . benzonatate (TESSALON) 100 MG capsule Take 1 capsule (100 mg total) 2 (two) times daily as needed by mouth for cough. (Patient not taking: Reported on 01/03/2018) 20 capsule 0  . gabapentin (NEURONTIN) 300 MG capsule Take 1 capsule (300 mg total) by mouth at bedtime. (Patient not taking: Reported on 01/03/2018) 30 capsule 2  . oxyCODONE-acetaminophen (PERCOCET/ROXICET) 5-325 MG tablet Take 1-2 tablets by mouth every 4 (four) hours as needed for severe pain. (Patient not taking: Reported on 01/03/2018) 6 tablet 0  . promethazine (PHENERGAN) 12.5 MG tablet Take 1 tablet (12.5 mg total) by mouth every 6 (six) hours as needed for nausea or vomiting. 30 tablet 0    No current facility-administered medications for this visit.             Physical Exam BP 122/81   Pulse 75  Ht  (1.651 m)   Wt 190 lb (86.2 kg)   BMI 31.62 kg/m   Physical Exam  General appearance is normal   She is oriented x3   Mood is pleasant affect is flat Ambulatory status using crutches and wheelchair cannot bear weight right leg Ortho Exam   Right upper extremity inspection normal palpation crepitance in the right shoulder passive range of motion painful after 90 degrees but otherwise full stability normal strength normal skin normal pulse normal sensation normal lymph nodes negative in the axilla   Left upper extremity inspection no deformity or tenderness full range of motion recorded shoulder stable muscle strength and tone normal skin is intact pulses are excellent lymph nodes are negative in the axilla sensation normal   Left lower extremity normal alignment full range of motion ligament stable in the knee hip and ankle strength muscle tone normal in the entire leg skin normal throughout the extremity pulse temperature edema normal pulses normal temperature no edema sensation normal   Right ankle normal reasonable alignment swelling skin looks normal painful range of motion limited by pain  ankle unstable to pronation external rotation stable and inversion muscle tone normal skin looks good as stated pulse and temperature normal no edema sensation is normal throughout the foot   Deep tendon reflexes are normal pathologic reflexes none   Coordination balance are poor lower extremity secondary to the fracture pain and recent trauma       MEDICAL DECISION SECTION  xrays ordered? NO   My independent reading of xrays: 1 set of x-rays are seen from the emergency room on May 16.  Closed reduction was performed.  Medial malleolus fracture fairly high fibular fracture suggesting syndesmosis injury with widening of the tib-fib clear space at the joint line there is a hint that there may be a small posterior malleolar avulsion fracture, looks like pronation external rotation injury         Encounter Diagnoses  Name Primary?  . Closed trimalleolar fracture of right ankle, initial encounter Yes  . Nausea and vomiting, intractability of vomiting not specified, unspecified vomiting type          PLAN:        Meds ordered this encounter  Medications  . promethazine (PHENERGAN) 12.5 MG tablet      Sig: Take 1 tablet (12.5 mg total) by mouth every 6 (six) hours as needed for nausea or vomiting.      Dispense:  30 tablet      Refill:  0      Patient advised ice and elevate over the weekend made aware that surgery will be postponed if there is blistering   I gave her some medicine for nausea   Plan is for open treatment internal fixation right ankle   Out of work 12 weeks   No weightbearing   The procedure has been fully reviewed with the patient; The risks and benefits of surgery have been discussed and explained and understood. Alternative treatment has also been reviewed, questions were encouraged and answered. The postoperative plan is also been reviewed.

## 2018-01-06 NOTE — Discharge Instructions (Signed)
No weightbearing on the operative extremity except for balance at which time you can place the heel on the floor       General Anesthesia, Adult, Care After These instructions provide you with information about caring for yourself after your procedure. Your health care provider may also give you more specific instructions. Your treatment has been planned according to current medical practices, but problems sometimes occur. Call your health care provider if you have any problems or questions after your procedure. What can I expect after the procedure? After the procedure, it is common to have:  Vomiting.  A sore throat.  Mental slowness.  It is common to feel:  Nauseous.  Cold or shivery.  Sleepy.  Tired.  Sore or achy, even in parts of your body where you did not have surgery.  Follow these instructions at home: For at least 24 hours after the procedure:  Do not: ? Participate in activities where you could fall or become injured. ? Drive. ? Use heavy machinery. ? Drink alcohol. ? Take sleeping pills or medicines that cause drowsiness. ? Make important decisions or sign legal documents. ? Take care of children on your own.  Rest. Eating and drinking  If you vomit, drink water, juice, or soup when you can drink without vomiting.  Drink enough fluid to keep your urine clear or pale yellow.  Make sure you have little or no nausea before eating solid foods.  Follow the diet recommended by your health care provider. General instructions  Have a responsible adult stay with you until you are awake and alert.  Return to your normal activities as told by your health care provider. Ask your health care provider what activities are safe for you.  Take over-the-counter and prescription medicines only as told by your health care provider.  If you smoke, do not smoke without supervision.  Keep all follow-up visits as told by your health care provider. This is  important. Contact a health care provider if:  You continue to have nausea or vomiting at home, and medicines are not helpful.  You cannot drink fluids or start eating again.  You cannot urinate after 8-12 hours.  You develop a skin rash.  You have fever.  You have increasing redness at the site of your procedure. Get help right away if:  You have difficulty breathing.  You have chest pain.  You have unexpected bleeding.  You feel that you are having a life-threatening or urgent problem. This information is not intended to replace advice given to you by your health care provider. Make sure you discuss any questions you have with your health care provider. Document Released: 11/12/2000 Document Revised: 01/09/2016 Document Reviewed: 07/21/2015 Elsevier Interactive Patient Education  [employer].

## 2018-01-06 NOTE — Anesthesia Procedure Notes (Addendum)
Anesthesia Regional Block: Popliteal block   Pre-Anesthetic Checklist: ,, timeout performed, Correct Patient, Correct Site, Correct Laterality, Correct Procedure, Correct Position, site marked, Risks and benefits discussed, at surgeon's request and post-op pain management  Laterality: Right  Prep: chloraprep       Needles:  Injection technique: Single-shot  Needle Type: Stimulator Needle - 80     Needle Length: 10cm  Needle Gauge: 22   Needle insertion depth: 5 cm   Additional Needles:   Procedures:,,,, ultrasound used (permanent image in chart),,,,  Narrative:  Start time: 01/06/2018 12:15 PM End time: 01/06/2018 12:20 PM  Performed by: Personally  Anesthesiologist: Shona Needles, MD  Additional Notes: BP cuff, EKG monitors applied. Sterile prep. Sedation begun (versed  titrated for Pop/adductor blocks). Ultrasound used for location of nerve. After nerve located, anesthetic injected incrementally, slowly , and after neg aspirations. Tolerated well. 20cc with  Ropivicaine with  decadron

## 2018-01-06 NOTE — Op Note (Signed)
Operative report  Preop diagnosis trimalleolar closed fracture right ankle  Postop diagnosis same  Procedure open treatment internal fixation lateral malleolus and medial malleolus with ankle solutions one third tubular plate 7 hole laterally with 6 screws and one interfrag screw.  All screws were nonlocking screws  All screws were cortical screws  Medial side we placed #2, 46 mm screws 1 partially-threaded 1 fully threaded  Operative findings trimalleolar fracture with posterior malleolar avulsion.  Direct visualization of the syndesmosis showed no disruption and there was no disruption under stress test external rotation under fluoroscopy  Surgeon was [facility] by [place]  Anesthesia placed a popliteal and abductor block for postop anesthesia and then did general anesthesia  Surgery was done  in the following manner: The patient was identified in the preop area the surgical site was marked the skin was checked there was one small blister over the medial malleolus.  There was minimal swelling skin wrinkled nicely  The patient was taken to surgery for general anesthesia after abductor and popliteal block was placed in the preop area  The right leg was prepped and draped sterilely we did use a tourniquet  We took a timeout before placing the exsanguination on the limb and using the tourniquet.  Once the limb was exsanguinated tourniquet was elevated we made a lateral incision  A lateral incision was made over the fibula slightly anteriorly divided down to bone and then the fracture was exposed irrigated and reduced manually with reduction clamps.  X-ray confirmed reduction we placed a lag screw in standard AO fashion with a 3 5 proximal hole using a 3 5 drill bit followed by 2.5 distal hole we used a countersink measured and then placed a cortical screw.  X-ray confirmed reduction of the fracture and the mortise  We proceeded to place a one third tubular plate using 6 of  the 7 holes with the AO technique we used all non- locking cortical screws  After x-ray confirmed position of the plate that the ankle was reduced with the fibular length the restored ankle mortise restored returned our attention medially  We made a curvilinear incision starting posterior to the medial malleolus to avoid the anterior blister we curved it in line with the flexor tendons took that down through the subcutaneous tissue and 1 full flap we exposed the fracture and the joint irrigated the joint removed several pieces of cartilage and soft tissue and bone debris and then placed a sharp tipped bone reduction clamp to hold the fracture reduced.  Radiographs and direct visualization confirmed reduction of the fracture we then placed to threaded K wires and checked those for position with x-ray.  Once that was satisfactory we overdrilled the guidewires and then placed 2, 46 mm screws one was partially threaded one was fully threaded  Final x-rays including lateral x-ray confirmed reduction of the mortise  We then irrigated both wounds copiously.  I closed the medial wound with 3-0 nylon suture in interrupted fashion  I closed the lateral wound with 0 Monocryl and staples  The skin was cleaned dried and a sterile dressing was applied on each wound followed by a sugar tong splint  The tourniquet was released prior to application of the splint  The patient was extubated taken to recovery room in stable condition  27822 internal fixation trimalleolar fracture without fixation of posterior malleolus

## 2018-01-06 NOTE — Interval H&P Note (Signed)
History and Physical Interval Note:  01/06/2018 1:03 PM  Janice Gonzales  has presented today for surgery, with the diagnosis of Unstable trimalleolar right ankle fracture dislocation  The various methods of treatment have been discussed with the patient and family. After consideration of risks, benefits and other options for treatment, the patient has consented to  Procedure(s): OPEN REDUCTION INTERNAL FIXATION (ORIF) RIGHT ANKLE FRACTURE (Right) as a surgical intervention .  The patient's history has been reviewed, patient examined, no change in status, stable for surgery.  I have reviewed the patient's chart and labs.  Questions were answered to the patient's satisfaction.     Fuller [place]

## 2018-01-06 NOTE — Anesthesia Postprocedure Evaluation (Signed)
Anesthesia Post Note  Patient: Janice Gonzales  Procedure(s) Performed: OPEN REDUCTION INTERNAL FIXATION (ORIF) RIGHT ANKLE FRACTURE (Right )  Patient location during evaluation: PACU Anesthesia Type: Regional Level of consciousness: awake and alert and oriented Pain management: pain level controlled Vital Signs Assessment: post-procedure vital signs reviewed and stable Respiratory status: spontaneous breathing Cardiovascular status: stable Postop Assessment: no apparent nausea or vomiting Anesthetic complications: no     Last Vitals:  Vitals:   01/06/18 1305 01/06/18 1450  BP:    Pulse:  (!) 101  Resp: (!) 33 12  Temp:  36.9 C  SpO2: 98% 93%    Last Pain:  Vitals:   01/06/18 1450  TempSrc:   PainSc: Asleep                 ADAMS, AMY A

## 2018-01-06 NOTE — Anesthesia Preprocedure Evaluation (Signed)
Anesthesia Evaluation  Patient identified by MRN, date of birth, ID band Patient awake    Reviewed: Allergy & Precautions, H&P , NPO status , Patient's Chart, lab work & pertinent test results  Airway Mallampati: II  TM Distance: >3 FB Neck ROM: full    Dental no notable dental hx.    Pulmonary neg pulmonary ROS, sleep apnea ,    Pulmonary exam normal breath sounds clear to auscultation       Cardiovascular Exercise Tolerance: Good hypertension, negative cardio ROS   Rhythm:regular Rate:Normal     Neuro/Psych negative neurological ROS  negative psych ROS   GI/Hepatic negative GI ROS, Neg liver ROS,   Endo/Other  negative endocrine ROS  Renal/GU negative Renal ROS  negative genitourinary   Musculoskeletal   Abdominal   Peds  Hematology negative hematology ROS (+)   Anesthesia Other Findings   Reproductive/Obstetrics negative OB ROS                             Anesthesia Physical Anesthesia Plan  ASA: II  Anesthesia Plan: General and Regional   Post-op Pain Management:    Induction:   PONV Risk Score and Plan:   Airway Management Planned:   Additional Equipment:   Intra-op Plan:   Post-operative Plan:   Informed Consent: I have reviewed the patients History and Physical, chart, labs and discussed the procedure including the risks, benefits and alternatives for the proposed anesthesia with the patient or authorized representative who has indicated his/her understanding and acceptance.     Plan Discussed with: CRNA  Anesthesia Plan Comments:         Anesthesia Quick Evaluation

## 2018-01-06 NOTE — Transfer of Care (Signed)
Immediate Anesthesia Transfer of Care Note  Patient: Janice Gonzales  Procedure(s) Performed: OPEN REDUCTION INTERNAL FIXATION (ORIF) RIGHT ANKLE FRACTURE (Right )  Patient Location: PACU  Anesthesia Type:General  Level of Consciousness: awake, alert  and oriented  Airway & Oxygen Therapy: Patient Spontanous Breathing and Patient connected to nasal cannula oxygen  Post-op Assessment: Report given to RN and Post -op Vital signs reviewed and stable  Post vital signs: Reviewed and stable  Last Vitals:  Vitals Value Taken Time  BP    Temp    Pulse 101 01/06/2018  2:49 PM  Resp 12 01/06/2018  2:49 PM  SpO2 90 % 01/06/2018  2:49 PM  Vitals shown include unvalidated device data.  Last Pain:  Vitals:   01/06/18 1050  TempSrc: Oral  PainSc: 0-No pain      Patients Stated Pain Goal: 5 (01/06/18 1050)  Complications: No apparent anesthesia complications

## 2018-01-06 NOTE — Anesthesia Procedure Notes (Signed)
Procedure Name: LMA Insertion Date/Time: 01/06/2018 1:16 PM Performed by: Shanon Payor, CRNA Pre-anesthesia Checklist: Patient identified, Emergency Drugs available, Suction available, Patient being monitored and Timeout performed Patient Re-evaluated:Patient Re-evaluated prior to induction Oxygen Delivery Method: Circle system utilized Preoxygenation: Pre-oxygenation with 100% oxygen Induction Type: IV induction LMA: LMA inserted LMA Size: 4.0 Number of attempts: 1 Placement Confirmation: positive ETCO2 and breath sounds checked- equal and bilateral Tube secured with: Tape Dental Injury: Teeth and Oropharynx as per pre-operative assessment

## 2018-01-07 ENCOUNTER — Encounter (HOSPITAL_COMMUNITY): Payer: Self-pay | Admitting: Orthopedic Surgery

## 2018-01-09 DIAGNOSIS — Z9889 Other specified postprocedural states: Secondary | ICD-10-CM | POA: Insufficient documentation

## 2018-01-09 DIAGNOSIS — Z8781 Personal history of (healed) traumatic fracture: Secondary | ICD-10-CM | POA: Insufficient documentation

## 2018-01-10 ENCOUNTER — Encounter: Payer: Self-pay | Admitting: Orthopedic Surgery

## 2018-01-10 ENCOUNTER — Ambulatory Visit (INDEPENDENT_AMBULATORY_CARE_PROVIDER_SITE_OTHER): Payer: Self-pay | Admitting: Orthopedic Surgery

## 2018-01-10 VITALS — BP 123/81 | HR 98 | Ht 65.0 in

## 2018-01-10 DIAGNOSIS — S82851D Displaced trimalleolar fracture of right lower leg, subsequent encounter for closed fracture with routine healing: Secondary | ICD-10-CM

## 2018-01-10 DIAGNOSIS — Z9889 Other specified postprocedural states: Secondary | ICD-10-CM

## 2018-01-10 DIAGNOSIS — Z8781 Personal history of (healed) traumatic fracture: Secondary | ICD-10-CM

## 2018-01-10 DIAGNOSIS — Z967 Presence of other bone and tendon implants: Secondary | ICD-10-CM

## 2018-01-10 MED ORDER — HYDROCODONE-ACETAMINOPHEN 10-325 MG PO TABS: 1.0000 | ORAL_TABLET | ORAL | 0 refills | Status: DC | PRN

## 2018-01-10 NOTE — Patient Instructions (Signed)
No weightbearing on operative extremity  4 times a day ankle range of motion exercises  Wear the Aircast when you are walking you can remove it for bedtime

## 2018-01-10 NOTE — Progress Notes (Signed)
POSTOP VISIT  POD # 4  Chief Complaint  Patient presents with  . Post-op Follow-up    ORIF right ankle     Postop day 4 trimalleolar fracture fixation medial and lateral   Encounter Diagnoses  Name Primary?  . S/P ORIF (open reduction internal fixation) fracture right ankle 01/06/18   . Closed trimalleolar fracture of right ankle with routine healing, subsequent encounter Yes    Sutures look good medially staples look good laterally she does have a lot of swelling in the foot we worked on plantar flexion dorsiflexion exercises emphasizing getting the foot the plantigrade position  Postoperative plan (Work, [employer],  Meds ordered this encounter  Medications  . DISCONTD: HYDROcodone-acetaminophen (NORCO) 10-325 MG tablet    Sig: Take 1 tablet by mouth every 4 (four) hours as needed.    Dispense:  42 tablet    Refill:  0  . HYDROcodone-acetaminophen (NORCO) 10-325 MG tablet    Sig: Take 1 tablet by mouth every 4 (four) hours as needed.    Dispense:  42 tablet    Refill:  0  ,FU)  Out of work for a total of 12 weeks  No weightbearing  Follow-up in on June 3 remove sutures staples in x-ray and then placing weightbearing cast or cam walker depending on ankle motion

## 2018-01-20 ENCOUNTER — Ambulatory Visit (INDEPENDENT_AMBULATORY_CARE_PROVIDER_SITE_OTHER): Payer: BLUE CROSS/BLUE SHIELD

## 2018-01-20 ENCOUNTER — Encounter: Payer: Self-pay | Admitting: Orthopedic Surgery

## 2018-01-20 ENCOUNTER — Ambulatory Visit (INDEPENDENT_AMBULATORY_CARE_PROVIDER_SITE_OTHER): Payer: BLUE CROSS/BLUE SHIELD | Admitting: Orthopedic Surgery

## 2018-01-20 VITALS — BP 128/87 | HR 118 | Ht 65.0 in | Wt 190.0 lb

## 2018-01-20 DIAGNOSIS — Z8781 Personal history of (healed) traumatic fracture: Secondary | ICD-10-CM | POA: Diagnosis not present

## 2018-01-20 DIAGNOSIS — Z967 Presence of other bone and tendon implants: Secondary | ICD-10-CM | POA: Diagnosis not present

## 2018-01-20 DIAGNOSIS — Z9889 Other specified postprocedural states: Secondary | ICD-10-CM

## 2018-01-20 NOTE — Progress Notes (Signed)
POSTOP VISIT  POD # 14  Chief Complaint  Patient presents with  . Follow-up    Recheck on rigth ankle fracture, DOS 01-06-18.   90day  04/06/2018  62 year old female status post medial lateral fixation of bimalleolar fracture doing well complains of tingling on the dorsum of the foot last 3 digits of the foot   Encounter Diagnosis  Name Primary?  . S/P ORIF (open reduction internal fixation) fracture right ankle 01/06/18 Yes    Sutures and staples extracted wounds look clean dry and intact she does have decreased sensation on the dorsum of the foot today's x-ray looks normal she is placed in a short leg walking cast   Postoperative plan (Work, WB, No orders of the defined types were placed in this encounter. ,FU)  Work status out of work weightbearing status as tolerated with cast and cast shoe

## 2018-01-20 NOTE — Patient Instructions (Signed)
Weight bear as tolerated

## 2018-01-21 ENCOUNTER — Telehealth: Payer: Self-pay | Admitting: Orthopedic Surgery

## 2018-01-21 NOTE — Telephone Encounter (Signed)
Forms, including signed authorization, have been completed regarding travel for which patient cancelled due to medical reasons, for [employer] for patient to forward to her travel agency (ph# [phone], fax# [phone]).  Copy retained for scanning.

## 2018-01-23 ENCOUNTER — Encounter: Payer: Self-pay | Admitting: Family Medicine

## 2018-01-24 ENCOUNTER — Encounter: Payer: Self-pay | Admitting: Family Medicine

## 2018-02-03 ENCOUNTER — Encounter: Payer: Self-pay | Admitting: Orthopedic Surgery

## 2018-02-03 ENCOUNTER — Ambulatory Visit (INDEPENDENT_AMBULATORY_CARE_PROVIDER_SITE_OTHER): Payer: Self-pay | Admitting: Orthopedic Surgery

## 2018-02-03 VITALS — BP 131/86 | HR 100 | Ht 65.0 in

## 2018-02-03 DIAGNOSIS — Z9889 Other specified postprocedural states: Secondary | ICD-10-CM

## 2018-02-03 DIAGNOSIS — S82851D Displaced trimalleolar fracture of right lower leg, subsequent encounter for closed fracture with routine healing: Secondary | ICD-10-CM | POA: Diagnosis not present

## 2018-02-03 DIAGNOSIS — Z967 Presence of other bone and tendon implants: Secondary | ICD-10-CM

## 2018-02-03 DIAGNOSIS — Z8781 Personal history of (healed) traumatic fracture: Secondary | ICD-10-CM

## 2018-02-03 NOTE — Progress Notes (Signed)
Recheck on right ankle fracture, DOS 01-06-18.    90day  04/06/2018  Instructions      Return in about 2 weeks (around 02/03/2018) for POST OP VISIT ROUTINE bimalleolar ankle fracture remove cast place in walking boot no x-ray needed u.  Weight-bear as tolerated       Chief Complaint  Patient presents with  . Post-op Follow-up    right ankle ORIF 01/06/18   Pod # 28  The patient has normal incisions she is a little tight but I think she can get the foot in a boot  Weight-bear as tolerated  Follow-up 2 weeks for x-ray

## 2018-02-06 ENCOUNTER — Telehealth: Payer: Self-pay | Admitting: Orthopedic Surgery

## 2018-02-06 NOTE — Telephone Encounter (Signed)
I called patient, she is asking about restrictions now that she is able to walk in the boot. I told her as tolerated. If it hurts, don't do it. She voiced understanding   To you FYI

## 2018-02-06 NOTE — Telephone Encounter (Signed)
Patient would like for you to give her a call, she has some questions.  Please call and advise.

## 2018-03-03 ENCOUNTER — Ambulatory Visit (INDEPENDENT_AMBULATORY_CARE_PROVIDER_SITE_OTHER): Payer: BLUE CROSS/BLUE SHIELD | Admitting: Orthopedic Surgery

## 2018-03-03 ENCOUNTER — Ambulatory Visit (INDEPENDENT_AMBULATORY_CARE_PROVIDER_SITE_OTHER): Payer: BLUE CROSS/BLUE SHIELD

## 2018-03-03 ENCOUNTER — Encounter: Payer: Self-pay | Admitting: Orthopedic Surgery

## 2018-03-03 VITALS — BP 135/82 | HR 75 | Ht 65.0 in | Wt 190.0 lb

## 2018-03-03 DIAGNOSIS — S82891D Other fracture of right lower leg, subsequent encounter for closed fracture with routine healing: Secondary | ICD-10-CM | POA: Diagnosis not present

## 2018-03-03 NOTE — Progress Notes (Signed)
Chief Complaint  Patient presents with  . Follow-up    Fracture right ankle 01/06/18   C/O PARESTHESIAS DORSAL FOOT   C/O TIGHT HEEL CORD   XRAYS: LOOK GREAT SEE REPORT   WOUNDS ARE CLEAN   WBAT IN BRACE // AROM 3 X A DAY   XR IN 6 WEEKS

## 2018-04-04 ENCOUNTER — Telehealth: Payer: Self-pay | Admitting: Orthopedic Surgery

## 2018-04-04 NOTE — Telephone Encounter (Signed)
Janice Gonzales called saying she is doing the exercises Dr. Romeo AppleHarrison gave her, but was wondering if there were any other exercises she can go to make her ankle more flexible.  Please call and advise

## 2018-04-07 NOTE — Telephone Encounter (Signed)
Dr Romeo AppleHarrison just wants her to work on ankle flexion, I have called her to advise. She voiced understanding.

## 2018-04-14 ENCOUNTER — Ambulatory Visit (INDEPENDENT_AMBULATORY_CARE_PROVIDER_SITE_OTHER): Payer: BLUE CROSS/BLUE SHIELD

## 2018-04-14 ENCOUNTER — Encounter: Payer: Self-pay | Admitting: Orthopedic Surgery

## 2018-04-14 ENCOUNTER — Ambulatory Visit (INDEPENDENT_AMBULATORY_CARE_PROVIDER_SITE_OTHER): Payer: BLUE CROSS/BLUE SHIELD | Admitting: Orthopedic Surgery

## 2018-04-14 ENCOUNTER — Telehealth: Payer: Self-pay | Admitting: Orthopedic Surgery

## 2018-04-14 VITALS — BP 125/78 | HR 80 | Ht 65.0 in | Wt 190.0 lb

## 2018-04-14 DIAGNOSIS — Z9889 Other specified postprocedural states: Secondary | ICD-10-CM

## 2018-04-14 DIAGNOSIS — Z967 Presence of other bone and tendon implants: Secondary | ICD-10-CM

## 2018-04-14 DIAGNOSIS — Z8781 Personal history of (healed) traumatic fracture: Secondary | ICD-10-CM

## 2018-04-14 NOTE — Progress Notes (Signed)
Chief Complaint  Patient presents with  . Follow-up    Recheck on right ankle fracture, DOS 01-06-18.    Pod # [id number]  Bimalleolar right ankle fracture patient doing well  X-rays show fracture healing  Exam shows neutral plantar flexion dorsiflexion with 20 degrees plantarflexion  Incisions are clean dry and intact  Recommend return to work in 2 weeks and regular shoes with 10 pound weight limit for lifting.  She can do that for 3 weeks then return to normal duty  Follow-up.  3 months no x-ray needed at that time unless having trouble

## 2018-04-14 NOTE — Telephone Encounter (Signed)
Patient wants you to give her a call. Stated she wants to talk with you or the doctor about needing more time in her shoe.  Please call and advise

## 2018-04-14 NOTE — Patient Instructions (Signed)
Return to work on September 9 light duty with a 10 pound lifting restriction for 3 weeks and then full duty

## 2018-04-15 NOTE — Telephone Encounter (Signed)
Patient wants to know if you can change work note, she does not think she can go back on Sept 9th, wants to know if you will extend to Sept 30th.

## 2018-04-16 NOTE — Telephone Encounter (Signed)
Advised patient work note is ready, at front desk. She wants to know if we have received anything from [employer] [facility]. This is her disability company.

## 2018-04-16 NOTE — Telephone Encounter (Signed)
ok 

## 2018-04-17 ENCOUNTER — Telehealth: Payer: Self-pay | Admitting: Orthopedic Surgery

## 2018-04-17 DIAGNOSIS — Z9889 Other specified postprocedural states: Secondary | ICD-10-CM

## 2018-04-17 DIAGNOSIS — Z8781 Personal history of (healed) traumatic fracture: Secondary | ICD-10-CM

## 2018-04-17 NOTE — Telephone Encounter (Signed)
Compression stocking 20 mm with [employer] aid  Physical therapy: Twice a week 6 weeks

## 2018-04-17 NOTE — Telephone Encounter (Signed)
Called patient to advise order for stockings at front desk and order for the PT sent to [facility]

## 2018-04-17 NOTE — Telephone Encounter (Signed)
Janice Gonzales wants to know if you think she would benefit from wearing compression stocking on her foot/ankle to help with any swelling.  She also wants to know if she should maybe having some physical therapy.   Please advise on both  Thanks

## 2018-04-22 ENCOUNTER — Telehealth: Payer: Self-pay | Admitting: Orthopedic Surgery

## 2018-04-22 NOTE — Telephone Encounter (Signed)
Patient called saying she has not heard from [facility]. I explained the referral has just been put in a few days ago and with the holiday it will probably take them a few more days to get in contact with her. She asked for the phone number and where Physical Therapy was located. I gave her the number and explained where they are located.

## 2018-04-23 NOTE — Telephone Encounter (Signed)
Updated note was given to patient as noted; signed authorization on file. Loletta Parish has also requested verification of updated work status - Dr Romeo Apple addressed, and forms faxed back to [employer] on 04/22/18; patient aware.

## 2018-04-24 ENCOUNTER — Other Ambulatory Visit: Payer: Self-pay

## 2018-04-24 ENCOUNTER — Ambulatory Visit (HOSPITAL_COMMUNITY): Payer: BLUE CROSS/BLUE SHIELD | Attending: Orthopedic Surgery

## 2018-04-24 ENCOUNTER — Encounter (HOSPITAL_COMMUNITY): Payer: Self-pay

## 2018-04-24 DIAGNOSIS — R2689 Other abnormalities of gait and mobility: Secondary | ICD-10-CM | POA: Diagnosis not present

## 2018-04-24 DIAGNOSIS — R262 Difficulty in walking, not elsewhere classified: Secondary | ICD-10-CM | POA: Diagnosis not present

## 2018-04-24 DIAGNOSIS — M25671 Stiffness of right ankle, not elsewhere classified: Secondary | ICD-10-CM | POA: Diagnosis not present

## 2018-04-24 DIAGNOSIS — M6281 Muscle weakness (generalized): Secondary | ICD-10-CM | POA: Diagnosis not present

## 2018-04-24 NOTE — Patient Instructions (Signed)
Access Code: [email or web address]  URL: [email or web address]  Date: 04/24/2018  Prepared by: Jac Canavan   Exercises Seated Ankle Alphabet - 10 reps - 3 sets - 1x daily - 7x weekly Standing Bilateral Gastroc Stretch with Step - 3-5 reps - 30-60seconds hold - 1x daily - 7x weekly

## 2018-04-24 NOTE — Therapy (Signed)
[facility] [address] [place], [place], [address] Phone: [phone]   Fax:  [phone]  Physical Therapy Evaluation  Patient Details  Name: Janice Gonzales MRN: [id number] Date of Birth: Apr 23, 1956 Referring Provider: Fuller Canada, MD   Encounter Date: 04/24/2018  PT End of Session - 04/24/18 1214    Visit Number  1    Number of Visits  13    Date for PT Re-Evaluation  05/22/18    Authorization Type  BCBS Other    Authorization Time Period  04/24/18 to 05/22/18    Authorization - Visit Number  1    Authorization - Number of Visits  60   medical eligibility review after 25th visit   PT Start Time  1039   pt signed in late   PT Stop Time  1119    PT Time Calculation (min)  40 min    Activity Tolerance  Patient tolerated treatment well;No increased pain    Behavior During Therapy  WFL for tasks assessed/performed       Past Medical History:  Diagnosis Date  . Arthritis   . Glaucoma   . History of DVT of lower extremity    with PE  . Hyperlipidemia 07/05/2017  . Hypertension   . Post herpetic neuralgia 06/17/2017  . Sleep apnea     Past Surgical History:  Procedure Laterality Date  . ORIF ANKLE FRACTURE Right 01/06/2018   Procedure: OPEN REDUCTION INTERNAL FIXATION (ORIF) RIGHT ANKLE FRACTURE;  Surgeon: Vickki Hearing, MD;  Location: [facility];  Service: Orthopedics;  Laterality: Right;    There were no vitals filed for this visit.   Subjective Assessment - 04/24/18 1043    Subjective  Pt states that she fell on 01/02/18, broke her R ankle and had ORIF on 01/06/18 by Dr. Romeo Apple. She is currently WBAT without a boot. She states that she has mainly been working on pulling her foot up and down as well as some circles and heel raises. She's still having some swelling, some numbness on the top of her foot and toes, but mainly walking. She's also having difficulty with going up/down steps. Her pain has been well controlled.     Limitations  Standing;Walking;House hold activities    How long can you sit comfortably?  no issues    How long can you stand comfortably?  5-10 mins    How long can you walk comfortably?  varies, but can walk around her block in her neighborhood (1/4 mile)    Patient Stated Goals  walk without a limp and walk normally again    Currently in Pain?  No/denies         [facility] PT Assessment - 04/24/18 0001      Assessment   Medical Diagnosis  s/p ORIF R ankle    Referring Provider  Fuller Canada, MD    Onset Date/Surgical Date  01/06/18    Next MD Visit  in 3 months (November)    Prior Therapy  none for current issues      Precautions   Precautions  None      Restrictions   Weight Bearing Restrictions  No      Balance Screen   Has the patient fallen in the past 6 months  Yes    How many times?  2   when she fell and broke her ankle and then 1x after surgery   Has the patient had a decrease in activity  level because of a fear of falling?   Yes    Is the patient reluctant to leave their home because of a fear of falling?   No      Prior Function   Level of Independence  Independent    Vocation  Full time employment   currently out of work until 05/19/18   Vocation Requirements  works at [employer], walking on concrete, Catering manager.    Leisure  play with grandkids, travel, go to [employer]' softball and baseball games      Cognition   Overall Cognitive Status  Within Functional Limits for tasks assessed      Observation/Other Assessments   Focus on Therapeutic Outcomes (FOTO)   to be completed next session      Observation/Other Assessments-Edema    Edema  Figure 8      Figure 8 Edema   Figure 8 - Right   54.25cm    Figure 8 - Left   50.5cm      ROM / Strength   AROM / PROM / Strength  AROM;Strength      AROM   AROM Assessment Site  Ankle    Right/Left Ankle  Right;Left    Right Ankle Dorsiflexion  -13    Right Ankle Plantar Flexion  50    Right Ankle  Inversion  9    Right Ankle Eversion  8    Left Ankle Dorsiflexion  1    Left Ankle Plantar Flexion  55    Left Ankle Inversion  20    Left Ankle Eversion  15      Strength   Strength Assessment Site  Hip;Knee;Ankle    Right Hip Flexion  5/5    Left Hip Flexion  5/5    Right Knee Flexion  5/5    Right Knee Extension  5/5    Left Knee Flexion  5/5    Left Knee Extension  5/5    Right Ankle Dorsiflexion  4/5    Right Ankle Inversion  5/5    Right Ankle Eversion  4+/5    Left Ankle Dorsiflexion  4+/5    Left Ankle Inversion  5/5    Left Ankle Eversion  5/5      Palpation   Palpation comment  mild tenderness to palpation thorughout R ankle joint; good scar mobility      Ambulation/Gait   Ambulation Distance (Feet)  452 Feet     Assistive device  None    Gait Pattern  Step-through pattern;Decreased stance time - right;Decreased dorsiflexion - right;Decreased weight shift to right   R hip and/or R foot ER     Balance   Balance Assessed  Yes      Static Standing Balance   Static Standing - Balance Support  No upper extremity supported    Static Standing Balance -  Activities   Single Leg Stance - Right Leg;Single Leg Stance - Left Leg    Static Standing - Comment/# of Minutes  R: 2 sec or < L: 30 sec            Objective measurements completed on examination: See above findings.        PT Education - 04/24/18 1214    Education Details  exam findings, POC, HEP    Person(s) Educated  Patient    Methods  Explanation;Handout    Comprehension  Verbalized understanding       PT Short Term Goals - 04/24/18 1219  PT SHORT TERM GOAL #1   Title  Pt will have improved R ankle AROM by 5 deg throughout in order to maximize gait.    Time  2    Period  Weeks    Status  New    Target Date  05/08/18      PT SHORT TERM GOAL #2   Title  Pt will have 1/2 grade improvement throughout ankle MMT in order to maximize gait and balance.    Time  2    Period  Weeks     Status  New      PT SHORT TERM GOAL #3   Title  Pt will be able to perform R SLS for 10 sec or > to demo improved functional ankle strength and maximize gait.    Time  2    Period  Weeks    Status  New        PT Long Term Goals - 04/24/18 1219      PT LONG TERM GOAL #1   Title  Pt will have decreased edema of R ankle by 2cm in order to maximize AROM.    Time  4    Period  Weeks    Status  New    Target Date  05/22/18      PT LONG TERM GOAL #2   Title  Pt will have improved R ankle DF to -5deg from neutral or better in order to further maximize gait and stair ambulation.    Time  4    Period  Weeks    Status  New      PT LONG TERM GOAL #3   Title  Pt will have at least 165ft improvement in with gait WFL in order to maximize her ability to RTW with minimal to no difficulties.    Time  4    Period  Weeks    Status  New      PT LONG TERM GOAL #4   Title  Pt will be able to perform R SLS for 20 sec or > to further demo improved functional ankle strength and maximize her stair ambulation.    Time  4    Period  Weeks    Status  New             Plan - 04/24/18 1215    Clinical Impression Statement  Pt is pleasant 62YO F who presents to OPPT s/p R ankle ORIF on 01/06/18 after sustaining a fall down the stairs. Pt currently presents with significant deficits in R ankle ROM, increased edema, and deficits in balance, gait, functional strength, and functional mobility. Pt was lacking 13deg from neutral for R ankle DF compared to her L ankle which was +1deg ankle DF. She also reports numbness on the top of her foot and lateral 3 toes along as some numbness at head of lateral 3 metatarsals, however, she reports that this has been improving since her surgery.Pt works at [employer] but is currently out of work until 05/19/18 and her work duties include lots of walking, standing, and pushing/moving appliances on the hand truck. Pt needs skilled PT intervention to address these impairments  in order to maximize ROM to promote return to PLOF and RTW.    Clinical Presentation  Stable    Clinical Presentation due to:  ROM, MMT, edema, SLS, , functional strength, functional mobility    Clinical Decision Making  Low    Rehab Potential  Good  PT Frequency  3x / week    PT Duration  4 weeks    PT Treatment/Interventions  ADLs/Self Care Home Management;Biofeedback;Cryotherapy;Electrical Stimulation;Moist Heat;Traction;Ultrasound;Gait training;Stair training;Functional mobility training;Therapeutic activities;Therapeutic exercise;Balance training;Neuromuscular re-education;Patient/family education;Manual techniques;Orthotic Fit/Training;Scar mobilization;Compression bandaging;Passive range of motion;Dry needling;Taping    PT Next Visit Plan  review goals and HEP; address limitations in AROM and overall strength as well as addressing joint mobility and swelling    PT Home Exercise Plan  eval: ABCs, standing calf stretch on step    Consulted and Agree with Plan of Care  Patient       Patient will benefit from skilled therapeutic intervention in order to improve the following deficits and impairments:  Abnormal gait, Decreased activity tolerance, Decreased balance, Decreased endurance, Decreased range of motion, Decreased strength, Difficulty walking, Hypomobility, Increased edema, Increased fascial restricitons, Increased muscle spasms, Impaired flexibility, Impaired sensation, Pain  Visit Diagnosis: Stiffness of right ankle, not elsewhere classified - Plan: PT plan of care cert/re-cert  Difficulty in walking, not elsewhere classified - Plan: PT plan of care cert/re-cert  Other abnormalities of gait and mobility - Plan: PT plan of care cert/re-cert  Muscle weakness (generalized) - Plan: PT plan of care cert/re-cert     Problem List Patient Active Problem List   Diagnosis Date Noted  . S/P ORIF (open reduction internal fixation) fracture right ankle 01/06/18 01/09/2018  .  Closed trimalleolar fracture of right ankle   . Hyperlipidemia 07/05/2017  . Vitamin D deficiency 07/05/2017  . Essential hypertension 06/17/2017  . Post herpetic neuralgia 06/17/2017  . History of pulmonary embolism 06/17/2017         Jac Canavan PT, DPT  [facility] [address] [place], [place], [address] Phone: [phone]   Fax:  [phone]  Name: Janice Gonzales MRN: [id number] Date of Birth: 07-Nov-1955

## 2018-04-30 ENCOUNTER — Ambulatory Visit (HOSPITAL_COMMUNITY): Payer: BLUE CROSS/BLUE SHIELD

## 2018-04-30 ENCOUNTER — Encounter (HOSPITAL_COMMUNITY): Payer: Self-pay

## 2018-04-30 DIAGNOSIS — R2689 Other abnormalities of gait and mobility: Secondary | ICD-10-CM

## 2018-04-30 DIAGNOSIS — R262 Difficulty in walking, not elsewhere classified: Secondary | ICD-10-CM

## 2018-04-30 DIAGNOSIS — M25671 Stiffness of right ankle, not elsewhere classified: Secondary | ICD-10-CM | POA: Diagnosis not present

## 2018-04-30 DIAGNOSIS — M6281 Muscle weakness (generalized): Secondary | ICD-10-CM | POA: Diagnosis not present

## 2018-04-30 NOTE — Therapy (Signed)
[facility] [address] [place], [place], [address] Phone: [phone]   Fax:  [phone]  Physical Therapy Treatment  Patient Details  Name: Janice Gonzales MRN: [id number] Date of Birth: [birth year] Referring Provider: Fuller Canada, MD   Encounter Date: 04/30/2018  PT End of Session - 04/30/18 1457    Visit Number  2    Number of Visits  13    Date for PT Re-Evaluation  05/22/18    Authorization Type  BCBS Other    Authorization Time Period  04/24/18 to 05/22/18    Authorization - Visit Number  2    Authorization - Number of Visits  60   medical eligibility review after 25th visit   PT Start Time  1034    PT Stop Time  1117    PT Time Calculation (min)  43 min    Activity Tolerance  Patient tolerated treatment well;No increased pain    Behavior During Therapy  WFL for tasks assessed/performed       Past Medical History:  Diagnosis Date  . Arthritis   . Glaucoma   . History of DVT of lower extremity    with PE  . Hyperlipidemia 07/05/2017  . Hypertension   . Post herpetic neuralgia 06/17/2017  . Sleep apnea     Past Surgical History:  Procedure Laterality Date  . ORIF ANKLE FRACTURE Right 01/06/2018   Procedure: OPEN REDUCTION INTERNAL FIXATION (ORIF) RIGHT ANKLE FRACTURE;  Surgeon: Vickki Hearing, MD;  Location: [facility];  Service: Orthopedics;  Laterality: Right;    There were no vitals filed for this visit.  Subjective Assessment - 04/30/18 1036    Subjective  Patient reports she went and bought new athletic shoes. Has been doing her HEP. Stiff when she gets up to walk. Uses her RW at night when needs to go to the bathroom.    Limitations  Standing;Walking;House hold activities    How long can you sit comfortably?  no issues    How long can you stand comfortably?  5-10 mins    How long can you walk comfortably?  varies, but can walk around her block in her neighborhood (1/4 mile)    Patient Stated Goals  walk  without a limp and walk normally again                       [facility] PT Treatment/Exercise - 04/30/18 0001      Exercises   Exercises  Ankle      Ankle Exercises: Stretches   Slant Board Stretch  2 reps;60 seconds      Ankle Exercises: Seated   Heel Slides  Right;10 reps    Heel Slides Limitations  towel slides    Other Seated Ankle Exercises  towel sweeps for inversion/eversion x10 reps             PT Education - 04/30/18 1459    Education Details  reviewed goals and eval, reviewed HEP, advanced HEP    Person(s) Educated  Patient    Methods  Explanation;Demonstration;Handout    Comprehension  Verbalized understanding;Need further instruction       PT Short Term Goals - 04/24/18 1219      PT SHORT TERM GOAL #1   Title  Pt will have improved R ankle AROM by 5 deg throughout in order to maximize gait.    Time  2    Period  Weeks  Status  New    Target Date  05/08/18      PT SHORT TERM GOAL #2   Title  Pt will have 1/2 grade improvement throughout ankle MMT in order to maximize gait and balance.    Time  2    Period  Weeks    Status  New      PT SHORT TERM GOAL #3   Title  Pt will be able to perform R SLS for 10 sec or > to demo improved functional ankle strength and maximize gait.    Time  2    Period  Weeks    Status  New        PT Long Term Goals - 04/24/18 1219      PT LONG TERM GOAL #1   Title  Pt will have decreased edema of R ankle by 2cm in order to maximize AROM.    Time  4    Period  Weeks    Status  New    Target Date  05/22/18      PT LONG TERM GOAL #2   Title  Pt will have improved R ankle DF to -5deg from neutral or better in order to further maximize gait and stair ambulation.    Time  4    Period  Weeks    Status  New      PT LONG TERM GOAL #3   Title  Pt will have at least 15ft improvement in with gait WFL in order to maximize her ability to RTW with minimal to no difficulties.    Time  4    Period   Weeks    Status  New      PT LONG TERM GOAL #4   Title  Pt will be able to perform R SLS for 20 sec or > to further demo improved functional ankle strength and maximize her stair ambulation.    Time  4    Period  Weeks    Status  New            Plan - 04/30/18 1457    Clinical Impression Statement  Patient returning for 2nd PT visit today. Review goals and eval. Reviewed HEP and added slantboard stretch, towel slides for dorsiflexion, towel sweeps for inversion/eversion and standing calf stretch against wall. These will need to be reviewed. Session focused on ROM. Will continue with current plan; progress as able.     Rehab Potential  Good    PT Frequency  3x / week    PT Duration  4 weeks    PT Treatment/Interventions  ADLs/Self Care Home Management;Biofeedback;Cryotherapy;Electrical Stimulation;Moist Heat;Traction;Ultrasound;Gait training;Stair training;Functional mobility training;Therapeutic activities;Therapeutic exercise;Balance training;Neuromuscular re-education;Patient/family education;Manual techniques;Orthotic Fit/Training;Scar mobilization;Compression bandaging;Passive range of motion;Dry needling;Taping    PT Next Visit Plan address limitations in AROM and overall strength as well as addressing joint mobility and swelling    PT Home Exercise Plan  eval: ABCs, standing calf stretch on step    Consulted and Agree with Plan of Care  Patient       Patient will benefit from skilled therapeutic intervention in order to improve the following deficits and impairments:  Abnormal gait, Decreased activity tolerance, Decreased balance, Decreased endurance, Decreased range of motion, Decreased strength, Difficulty walking, Hypomobility, Increased edema, Increased fascial restricitons, Increased muscle spasms, Impaired flexibility, Impaired sensation, Pain  Visit Diagnosis: Stiffness of right ankle, not elsewhere classified  Difficulty in walking, not elsewhere classified  Other  abnormalities of gait  and mobility  Muscle weakness (generalized)     Problem List Patient Active Problem List   Diagnosis Date Noted  . S/P ORIF (open reduction internal fixation) fracture right ankle 01/06/18 01/09/2018  . Closed trimalleolar fracture of right ankle   . Hyperlipidemia 07/05/2017  . Vitamin D deficiency 07/05/2017  . Essential hypertension 06/17/2017  . Post herpetic neuralgia 06/17/2017  . History of pulmonary embolism 06/17/2017    Katina Dung. Hartnett-Rands, MS, PT Per [facility] #[id number] 04/30/2018, 3:00 PM  [place] [address] [place], [place], [address] Phone: [phone]   Fax:  [phone]  Name: Janice Gonzales MRN: [id number] Date of Birth: [birth year]

## 2018-04-30 NOTE — Patient Instructions (Signed)
Calf Stretch    Place one leg forward, bent, other leg behind and straight. Lean forward keeping back heel flat. Hold _30___ seconds while counting out loud. Repeat with other leg forward. Repeat 3____ times. Do _3___ sessions per day.  http://[email or web address].exer.us/478   Copyright  VHI. All rights reserved.

## 2018-05-02 ENCOUNTER — Encounter (HOSPITAL_COMMUNITY): Payer: Self-pay

## 2018-05-02 ENCOUNTER — Ambulatory Visit (HOSPITAL_COMMUNITY): Payer: BLUE CROSS/BLUE SHIELD

## 2018-05-02 DIAGNOSIS — R2689 Other abnormalities of gait and mobility: Secondary | ICD-10-CM | POA: Diagnosis not present

## 2018-05-02 DIAGNOSIS — R262 Difficulty in walking, not elsewhere classified: Secondary | ICD-10-CM

## 2018-05-02 DIAGNOSIS — M6281 Muscle weakness (generalized): Secondary | ICD-10-CM | POA: Diagnosis not present

## 2018-05-02 DIAGNOSIS — M25671 Stiffness of right ankle, not elsewhere classified: Secondary | ICD-10-CM

## 2018-05-02 NOTE — Therapy (Signed)
[facility] [address] [place], [place], [address] Phone: [phone]   Fax:  [phone]  Physical Therapy Treatment  Patient Details  Name: Janice Gonzales MRN: [id number] Date of Birth: [birth year] Referring Provider: Fuller Canada, MD   Encounter Date: 05/02/2018  PT End of Session - 05/02/18 1039    Visit Number  3    Number of Visits  13    Date for PT Re-Evaluation  05/22/18    Authorization Type  BCBS Other    Authorization Time Period  04/24/18 to 05/22/18    Authorization - Visit Number  3    Authorization - Number of Visits  60   medical eligibility review after 25th visit   PT Start Time  1038    PT Stop Time  1116    PT Time Calculation (min)  38 min    Activity Tolerance  Patient tolerated treatment well;No increased pain    Behavior During Therapy  WFL for tasks assessed/performed       Past Medical History:  Diagnosis Date  . Arthritis   . Glaucoma   . History of DVT of lower extremity    with PE  . Hyperlipidemia 07/05/2017  . Hypertension   . Post herpetic neuralgia 06/17/2017  . Sleep apnea     Past Surgical History:  Procedure Laterality Date  . ORIF ANKLE FRACTURE Right 01/06/2018   Procedure: OPEN REDUCTION INTERNAL FIXATION (ORIF) RIGHT ANKLE FRACTURE;  Surgeon: Vickki Hearing, MD;  Location: [facility];  Service: Orthopedics;  Laterality: Right;    There were no vitals filed for this visit.  Subjective Assessment - 05/02/18 1039    Subjective  Not too sore after last session. Bought compression stockings but hasn't put them on yet. Has done HEP but not sure she is doing them right.    Limitations  Standing;Walking;House hold activities    How long can you sit comfortably?  no issues    How long can you stand comfortably?  5-10 mins    How long can you walk comfortably?  varies, but can walk around her block in her neighborhood (1/4 mile)    Patient Stated Goals  walk without a limp and walk  normally again    Currently in Pain?  Yes    Pain Score  4     Pain Location  Ankle    Pain Orientation  Right    Pain Descriptors / Indicators  Aching;Tightness                       [facility] Adult PT Treatment/Exercise - 05/02/18 0001      Manual Therapy   Manual Therapy  Joint mobilization;Edema management    Manual therapy comments  Manual completed separate from all other exercises    Edema Management  retrograde massage    Joint Mobilization  AP talocrural joint Grade II-IV 3x30 sec      Ankle Exercises: Seated   BAPS  Sitting;Level 1;10 reps    BAPS Limitations  A/P and M/L, clock, counterclock    Heel Slides  Right;10 reps    Heel Slides Limitations  towel slides; 5 sec holds    Other Seated Ankle Exercises  towel sweeps for inversion/eversion x10 reps      Ankle Exercises: Stretches   Slant Board Stretch  30 seconds;3 reps             PT Education - 05/02/18 1048  Education Details  Exercise technique and purpose. Reviewed HEP.    Person(s) Educated  Patient    Methods  Explanation;Demonstration    Comprehension  Verbalized understanding       PT Short Term Goals - 05/02/18 1052      PT SHORT TERM GOAL #1   Title  Pt will have improved R ankle AROM by 5 deg throughout in order to maximize gait.    Time  2    Period  Weeks    Status  On-going      PT SHORT TERM GOAL #2   Title  Pt will have 1/2 grade improvement throughout ankle MMT in order to maximize gait and balance.    Time  2    Period  Weeks    Status  On-going      PT SHORT TERM GOAL #3   Title  Pt will be able to perform R SLS for 10 sec or > to demo improved functional ankle strength and maximize gait.    Time  2    Period  Weeks    Status  On-going        PT Long Term Goals - 05/02/18 1052      PT LONG TERM GOAL #1   Title  Pt will have decreased edema of R ankle by 2cm in order to maximize AROM.    Time  4    Period  Weeks    Status  On-going      PT LONG TERM  GOAL #2   Title  Pt will have improved R ankle DF to -5deg from neutral or better in order to further maximize gait and stair ambulation.    Time  4    Period  Weeks    Status  On-going      PT LONG TERM GOAL #3   Title  Pt will have at least 158ft improvement in with gait WFL in order to maximize her ability to RTW with minimal to no difficulties.    Time  4    Period  Weeks    Status  On-going      PT LONG TERM GOAL #4   Title  Pt will be able to perform R SLS for 20 sec or > to further demo improved functional ankle strength and maximize her stair ambulation.    Time  4    Period  Weeks    Status  On-going            Plan - 05/02/18 1040    Clinical Impression Statement  Session focused on AROM exercises and reviewing HEP exercisesgiven last session to ensure completed properly. Manual therapy performed for edema manaagement and joint mobilizations to promote motion. Patient woulud benefit from continued skilled OPPT to address AROM, strength, balance, gait and functional deficits. Continue with current plan, progress as able.    Rehab Potential  Good    PT Frequency  3x / week    PT Duration  4 weeks    PT Treatment/Interventions  ADLs/Self Care Home Management;Biofeedback;Cryotherapy;Electrical Stimulation;Moist Heat;Traction;Ultrasound;Gait training;Stair training;Functional mobility training;Therapeutic activities;Therapeutic exercise;Balance training;Neuromuscular re-education;Patient/family education;Manual techniques;Orthotic Fit/Training;Scar mobilization;Compression bandaging;Passive range of motion;Dry needling;Taping    PT Next Visit Plan  address limitations in AROM and overall strength as well as addressing joint mobility and swelling; review HEP; add theraband strengthening into PF, DF, inv/ev likely seated; add standing balance and strengthening as able.     PT Home Exercise Plan  eval: ABCs, standing  calf stretch on step; 04/30/18 - towel slides/sweeps, standing  calf stretch    Consulted and Agree with Plan of Care  Patient       Patient will benefit from skilled therapeutic intervention in order to improve the following deficits and impairments:  Abnormal gait, Decreased activity tolerance, Decreased balance, Decreased endurance, Decreased range of motion, Decreased strength, Difficulty walking, Hypomobility, Increased edema, Increased fascial restricitons, Increased muscle spasms, Impaired flexibility, Impaired sensation, Pain  Visit Diagnosis: Stiffness of right ankle, not elsewhere classified  Difficulty in walking, not elsewhere classified  Other abnormalities of gait and mobility  Muscle weakness (generalized)     Problem List Patient Active Problem List   Diagnosis Date Noted  . S/P ORIF (open reduction internal fixation) fracture right ankle 01/06/18 01/09/2018  . Closed trimalleolar fracture of right ankle   . Hyperlipidemia 07/05/2017  . Vitamin D deficiency 07/05/2017  . Essential hypertension 06/17/2017  . Post herpetic neuralgia 06/17/2017  . History of pulmonary embolism 06/17/2017    Katina DungBarbara D. Hartnett-Rands, MS, PT Per Ladoris [facility] PT [id number] 05/02/2018, 12:42 PM  [facility] [address] [place], [place], [address] Phone: [phone]   Fax:  [phone]  Name: Janice Gonzales MRN: [id number] Date of Birth: [birth year]

## 2018-05-05 ENCOUNTER — Ambulatory Visit (HOSPITAL_COMMUNITY): Payer: BLUE CROSS/BLUE SHIELD

## 2018-05-05 ENCOUNTER — Encounter (HOSPITAL_COMMUNITY): Payer: Self-pay

## 2018-05-05 DIAGNOSIS — R2689 Other abnormalities of gait and mobility: Secondary | ICD-10-CM

## 2018-05-05 DIAGNOSIS — M6281 Muscle weakness (generalized): Secondary | ICD-10-CM

## 2018-05-05 DIAGNOSIS — M25671 Stiffness of right ankle, not elsewhere classified: Secondary | ICD-10-CM | POA: Diagnosis not present

## 2018-05-05 DIAGNOSIS — R262 Difficulty in walking, not elsewhere classified: Secondary | ICD-10-CM

## 2018-05-05 NOTE — Therapy (Signed)
[facility] [address] [place], [place], [address] Phone: [phone]   Fax:  [phone]  Physical Therapy Treatment  Patient Details  Name: Janice Gonzales MRN: [id number] Date of Birth: 05/11/1956 Referring Provider: Fuller CanadaStanley Harrison, MD   Encounter Date: 05/05/2018  PT End of Session - 05/05/18 1121    Visit Number  4    Number of Visits  13    Date for PT Re-Evaluation  05/22/18    Authorization Type  [employer]BS Other    Authorization Time Period  04/24/18 to 05/22/18    Authorization - Visit Number  4    Authorization - Number of Visits  60   medical eligibility review after 25th visit   PT Start Time  1118    PT Stop Time  1202    PT Time Calculation (min)  44 min    Activity Tolerance  Patient tolerated treatment well;No increased pain    Behavior During Therapy  WFL for tasks assessed/performed       Past Medical History:  Diagnosis Date  . Arthritis   . Glaucoma   . History of DVT of lower extremity    with PE  . Hyperlipidemia 07/05/2017  . Hypertension   . Post herpetic neuralgia 06/17/2017  . Sleep apnea     Past Surgical History:  Procedure Laterality Date  . ORIF ANKLE FRACTURE Right 01/06/2018   Procedure: OPEN REDUCTION INTERNAL FIXATION (ORIF) RIGHT ANKLE FRACTURE;  Surgeon: Vickki HearingHarrison, Stanley E, MD;  Location: [facility];  Service: Orthopedics;  Laterality: Right;    There were no vitals filed for this visit.  Subjective Assessment - 05/05/18 1122    Subjective  Pt states that she's feeling pretty good, but her R ankle is still stiff when she wakes up or sits for too long.    Limitations  Standing;Walking;House hold activities    How long can you sit comfortably?  no issues    How long can you stand comfortably?  5-10 mins    How long can you walk comfortably?  varies, but can walk around her block in her neighborhood (1/4 mile)    Patient Stated Goals  walk without a limp and walk normally again    Currently  in Pain?  Yes    Pain Score  3     Pain Location  Ankle    Pain Orientation  Right    Pain Descriptors / Indicators  Aching;Tightness         [facility] PT Assessment - 05/05/18 0001      AROM   Right Ankle Dorsiflexion  -11   knee bent          [facility] Adult PT Treatment/Exercise - 05/05/18 0001      Exercises   Exercises  Ankle      Manual Therapy   Manual Therapy  Joint mobilization    Manual therapy comments  Manual completed separate from all other exercises    Joint Mobilization  AP talocrural joint Grade II-IV 10x10-15" bouts; talocrural distractioon 3x15" holds      Ankle Exercises: Stretches   Slant Board Stretch  3 reps;30 seconds    Other Stretch  1/2 kneeling DF stretch at door 10x5-10" holds      Ankle Exercises: Standing   Other Standing Ankle Exercises  fwd step downs 4" step x10 reps for ankle DF      Ankle Exercises: Seated   Towel Crunch  1 rep  BAPS  Sitting;Level 3;15 reps    BAPS Limitations  DF/PF, inv/ev, CW/CCW    Heel Slides  Right;10 reps    Heel Slides Limitations  towel slides, 5 sec holds            PT Education - 05/05/18 1121    Education Details  exercise technique, continue HEP    Person(s) Educated  Patient    Methods  Explanation;Demonstration    Comprehension  Verbalized understanding;Returned demonstration       PT Short Term Goals - 05/02/18 1052      PT SHORT TERM GOAL #1   Title  Pt will have improved R ankle AROM by 5 deg throughout in order to maximize gait.    Time  2    Period  Weeks    Status  On-going      PT SHORT TERM GOAL #2   Title  Pt will have 1/2 grade improvement throughout ankle MMT in order to maximize gait and balance.    Time  2    Period  Weeks    Status  On-going      PT SHORT TERM GOAL #3   Title  Pt will be able to perform R SLS for 10 sec or > to demo improved functional ankle strength and maximize gait.    Time  2    Period  Weeks    Status  On-going        PT Long Term Goals -  05/02/18 1052      PT LONG TERM GOAL #1   Title  Pt will have decreased edema of R ankle by 2cm in order to maximize AROM.    Time  4    Period  Weeks    Status  On-going      PT LONG TERM GOAL #2   Title  Pt will have improved R ankle DF to -5deg from neutral or better in order to further maximize gait and stair ambulation.    Time  4    Period  Weeks    Status  On-going      PT LONG TERM GOAL #3   Title  Pt will have at least 150ft improvement in with gait WFL in order to maximize her ability to RTW with minimal to no difficulties.    Time  4    Period  Weeks    Status  On-going      PT LONG TERM GOAL #4   Title  Pt will be able to perform R SLS for 20 sec or > to further demo improved functional ankle strength and maximize her stair ambulation.    Time  4    Period  Weeks    Status  On-going            Plan - 05/05/18 1205    Clinical Impression Statement  Pt continues to display deficits in R ankle mobility, especially R ankle DF. Added 1/2 kneeling DF stretch, fwd step downs, and towel scrunches for mobility work and intrinsic foot strengthening. Ended with manual for joint mobility and pain control. Her walking at EOS was noted to be almost [facility], though still limited due to range. R ankle DF -11deg at EOS (was -13 at eval). Continue as planned, progressing as able.     Rehab Potential  Good    PT Frequency  3x / week    PT Duration  4 weeks    PT Treatment/Interventions  ADLs/Self  Care Home Management;Biofeedback;Cryotherapy;Electrical Stimulation;Moist Heat;Traction;Ultrasound;Gait training;Stair training;Functional mobility training;Therapeutic activities;Therapeutic exercise;Balance training;Neuromuscular re-education;Patient/family education;Manual techniques;Orthotic Fit/Training;Scar mobilization;Compression bandaging;Passive range of motion;Dry needling;Taping    PT Next Visit Plan  address limitations in AROM and overall strength as well as addressing joint  mobility and swelling; add theraband strengthening into PF, DF, inv/ev likely seated; add standing balance and strengthening as able.     PT Home Exercise Plan  eval: ABCs, standing calf stretch on step; 04/30/18 - towel slides/sweeps, standing calf stretch; 9/16: 1/2 kneel stretch for DF    Consulted and Agree with Plan of Care  Patient       Patient will benefit from skilled therapeutic intervention in order to improve the following deficits and impairments:  Abnormal gait, Decreased activity tolerance, Decreased balance, Decreased endurance, Decreased range of motion, Decreased strength, Difficulty walking, Hypomobility, Increased edema, Increased fascial restricitons, Increased muscle spasms, Impaired flexibility, Impaired sensation, Pain  Visit Diagnosis: Stiffness of right ankle, not elsewhere classified  Difficulty in walking, not elsewhere classified  Other abnormalities of gait and mobility  Muscle weakness (generalized)     Problem List Patient Active Problem List   Diagnosis Date Noted  . S/P ORIF (open reduction internal fixation) fracture right ankle 01/06/18 01/09/2018  . Closed trimalleolar fracture of right ankle   . Hyperlipidemia 07/05/2017  . Vitamin D deficiency 07/05/2017  . Essential hypertension 06/17/2017  . Post herpetic neuralgia 06/17/2017  . History of pulmonary embolism 06/17/2017       Jac Canavan PT, DPT  [facility] [address] [place], [place], [address] Phone: [phone]   Fax:  [phone]  Name: Janice Gonzales MRN: [id number] Date of Birth: 1955/08/22

## 2018-05-07 ENCOUNTER — Ambulatory Visit (HOSPITAL_COMMUNITY): Payer: BLUE CROSS/BLUE SHIELD

## 2018-05-07 ENCOUNTER — Encounter (HOSPITAL_COMMUNITY): Payer: Self-pay

## 2018-05-07 DIAGNOSIS — R262 Difficulty in walking, not elsewhere classified: Secondary | ICD-10-CM | POA: Diagnosis not present

## 2018-05-07 DIAGNOSIS — R2689 Other abnormalities of gait and mobility: Secondary | ICD-10-CM | POA: Diagnosis not present

## 2018-05-07 DIAGNOSIS — M6281 Muscle weakness (generalized): Secondary | ICD-10-CM

## 2018-05-07 DIAGNOSIS — M25671 Stiffness of right ankle, not elsewhere classified: Secondary | ICD-10-CM

## 2018-05-07 NOTE — Therapy (Signed)
[facility] [address] [place], [place], [address] Phone: [phone]   Fax:  [phone]  Physical Therapy Treatment  Patient Details  Name: Janice Gonzales MRN: [id number] Date of Birth: 10/14/1955 Referring Provider: Fuller Canada, MD   Encounter Date: 05/07/2018  PT End of Session - 05/07/18 1117    Visit Number  5    Number of Visits  13    Date for PT Re-Evaluation  05/22/18    Authorization Type  BCBS Other    Authorization Time Period  04/24/18 to 05/22/18    Authorization - Visit Number  5    Authorization - Number of Visits  60   medical eligibility review after 25th visit   PT Start Time  1117    PT Stop Time  1201    PT Time Calculation (min)  44 min    Activity Tolerance  Patient tolerated treatment well;No increased pain    Behavior During Therapy  WFL for tasks assessed/performed       Past Medical History:  Diagnosis Date  . Arthritis   . Glaucoma   . History of DVT of lower extremity    with PE  . Hyperlipidemia 07/05/2017  . Hypertension   . Post herpetic neuralgia 06/17/2017  . Sleep apnea     Past Surgical History:  Procedure Laterality Date  . ORIF ANKLE FRACTURE Right 01/06/2018   Procedure: OPEN REDUCTION INTERNAL FIXATION (ORIF) RIGHT ANKLE FRACTURE;  Surgeon: Vickki Hearing, MD;  Location: [facility];  Service: Orthopedics;  Laterality: Right;    There were no vitals filed for this visit.  Subjective Assessment - 05/07/18 1117    Subjective  Pt states that she feels pretty good, she's just stiff in the mornings.    Limitations  Standing;Walking;House hold activities    How long can you sit comfortably?  no issues    How long can you stand comfortably?  5-10 mins    How long can you walk comfortably?  varies, but can walk around her block in her neighborhood (1/4 mile)    Patient Stated Goals  walk without a limp and walk normally again    Currently in Pain?  Yes    Pain Score  4     Pain  Location  Ankle    Pain Orientation  Right    Pain Descriptors / Indicators  Tightness            [facility] Adult PT Treatment/Exercise - 05/07/18 0001      Exercises   Exercises  Ankle      Manual Therapy   Manual Therapy  Joint mobilization;Edema management;Soft tissue mobilization    Manual therapy comments  Manual completed separate from all other exercises    Edema Management  retro massage for edema    Joint Mobilization  AP talocrural joint Grade II-IV 10x10-15" bouts; talocrural distractioon 3x15" holds    Soft tissue mobilization  STM L peroneals for pain control      Ankle Exercises: Stretches   Slant Board Stretch  3 reps;30 seconds    Other Stretch  R knee drives on 12" step 10x10" holds for DF      Ankle Exercises: Standing   Rocker Board  2 minutes    Rocker Board Limitations  R/L, A/P x2 mins each for ankle mobility    Other Standing Ankle Exercises  fwd step downs 4" step x10 reps for ankle DF    Other Standing  Ankle Exercises  Fitter board: DF/PF, inv/ev, CW/CCW x10 reps each               PT Short Term Goals - 05/02/18 1052      PT SHORT TERM GOAL #1   Title  Pt will have improved R ankle AROM by 5 deg throughout in order to maximize gait.    Time  2    Period  Weeks    Status  On-going      PT SHORT TERM GOAL #2   Title  Pt will have 1/2 grade improvement throughout ankle MMT in order to maximize gait and balance.    Time  2    Period  Weeks    Status  On-going      PT SHORT TERM GOAL #3   Title  Pt will be able to perform R SLS for 10 sec or > to demo improved functional ankle strength and maximize gait.    Time  2    Period  Weeks    Status  On-going        PT Long Term Goals - 05/02/18 1052      PT LONG TERM GOAL #1   Title  Pt will have decreased edema of R ankle by 2cm in order to maximize AROM.    Time  4    Period  Weeks    Status  On-going      PT LONG TERM GOAL #2   Title  Pt will have improved R ankle DF to -5deg from  neutral or better in order to further maximize gait and stair ambulation.    Time  4    Period  Weeks    Status  On-going      PT LONG TERM GOAL #3   Title  Pt will have at least 17400ft improvement in 3MWT with gait WFL in order to maximize her ability to RTW with minimal to no difficulties.    Time  4    Period  Weeks    Status  On-going      PT LONG TERM GOAL #4   Title  Pt will be able to perform R SLS for 20 sec or > to further demo improved functional ankle strength and maximize her stair ambulation.    Time  4    Period  Weeks    Status  On-going            Plan - 05/07/18 1209    Clinical Impression Statement  Progressed pt in standing mobility work this date by adding fitter board, rockerboard, and knee drives on 12" step. Pt with some tenderness at lateral ankle throughout but PT feels this is due to mm stretching and activation of peroneals, however, will continue to follow. Continued with talocrural joint mobs and distraction for ROM. Ended with massage for edema and STM for soft tissue restrictions of peroneals. AROM has improved to -10deg DF (was -13 at eval). PT able to assist pt to -9deg with knee bent and approximately -2-3deg with knee straight in supine. Decreased stiffness reported at EOS. Continue as planned, progressing as able.    Rehab Potential  Good    PT Frequency  3x / week    PT Duration  4 weeks    PT Treatment/Interventions  ADLs/Self Care Home Management;Biofeedback;Cryotherapy;Electrical Stimulation;Moist Heat;Traction;Ultrasound;Gait training;Stair training;Functional mobility training;Therapeutic activities;Therapeutic exercise;Balance training;Neuromuscular re-education;Patient/family education;Manual techniques;Orthotic Fit/Training;Scar mobilization;Compression bandaging;Passive range of motion;Dry needling;Taping    PT Next Visit Plan  add theraband strengthening into PF, DF, inv/ev likely seated; address limitations in AROM and overall strength as  well as addressing joint mobility and swelling; add standing balance and strengthening as able.     PT Home Exercise Plan  eval: ABCs, standing calf stretch on step; 04/30/18 - towel slides/sweeps, standing calf stretch; 9/16: 1/2 kneel stretch for DF    Consulted and Agree with Plan of Care  Patient       Patient will benefit from skilled therapeutic intervention in order to improve the following deficits and impairments:  Abnormal gait, Decreased activity tolerance, Decreased balance, Decreased endurance, Decreased range of motion, Decreased strength, Difficulty walking, Hypomobility, Increased edema, Increased fascial restricitons, Increased muscle spasms, Impaired flexibility, Impaired sensation, Pain  Visit Diagnosis: Stiffness of right ankle, not elsewhere classified  Difficulty in walking, not elsewhere classified  Other abnormalities of gait and mobility  Muscle weakness (generalized)     Problem List Patient Active Problem List   Diagnosis Date Noted  . S/P ORIF (open reduction internal fixation) fracture right ankle 01/06/18 01/09/2018  . Closed trimalleolar fracture of right ankle   . Hyperlipidemia 07/05/2017  . Vitamin D deficiency 07/05/2017  . Essential hypertension 06/17/2017  . Post herpetic neuralgia 06/17/2017  . History of pulmonary embolism 06/17/2017        Jac Canavan PT, DPT  [facility] [address] [place], [place], [address] Phone: [phone]   Fax:  [phone]  Name: KATHLEN SAKURAI MRN: [id number] Date of Birth: 06-04-1956

## 2018-05-09 ENCOUNTER — Telehealth (HOSPITAL_COMMUNITY): Payer: Self-pay

## 2018-05-09 ENCOUNTER — Encounter (HOSPITAL_COMMUNITY): Payer: Self-pay

## 2018-05-09 ENCOUNTER — Ambulatory Visit (HOSPITAL_COMMUNITY): Payer: BLUE CROSS/BLUE SHIELD

## 2018-05-09 DIAGNOSIS — R262 Difficulty in walking, not elsewhere classified: Secondary | ICD-10-CM

## 2018-05-09 DIAGNOSIS — R2689 Other abnormalities of gait and mobility: Secondary | ICD-10-CM

## 2018-05-09 DIAGNOSIS — M6281 Muscle weakness (generalized): Secondary | ICD-10-CM

## 2018-05-09 DIAGNOSIS — M25671 Stiffness of right ankle, not elsewhere classified: Secondary | ICD-10-CM

## 2018-05-09 NOTE — Telephone Encounter (Signed)
05/16/2018 apptment -Pt called she will keep this apptment-pt l/m for Brooke. NF 05/09/18

## 2018-05-09 NOTE — Therapy (Signed)
[facility] [place], [place], [address] Phone: [phone]   Fax:  [phone]  Physical Therapy Treatment  Patient Details  Name: Janice Gonzales MRN: [id number] Date of Birth: [birth year] Referring Provider: Arther Abbott, MD   Encounter Date: 05/09/2018  PT End of Session - 05/09/18 1118    Visit Number  6    Number of Visits  13    Date for PT Re-Evaluation  05/22/18    Authorization Type  BCBS Other    Authorization Time Period  04/24/18 to 05/22/18    Authorization - Visit Number  6    Authorization - Number of Visits  60   medical eligibility review after 25th visit   PT Start Time  1117    PT Stop Time  1201    PT Time Calculation (min)  44 min    Activity Tolerance  Patient tolerated treatment well;No increased pain    Behavior During Therapy  WFL for tasks assessed/performed       Past Medical History:  Diagnosis Date  . Arthritis   . Glaucoma   . History of DVT of lower extremity    with PE  . Hyperlipidemia 07/05/2017  . Hypertension   . Post herpetic neuralgia 06/17/2017  . Sleep apnea     Past Surgical History:  Procedure Laterality Date  . ORIF ANKLE FRACTURE Right 01/06/2018   Procedure: OPEN REDUCTION INTERNAL FIXATION (ORIF) RIGHT ANKLE FRACTURE;  Surgeon: Carole Civil, MD;  Location: [facility];  Service: Orthopedics;  Laterality: Right;    There were no vitals filed for this visit.  Subjective Assessment - 05/09/18 1120    Subjective  Pt reports that her ankle is a little stiff this morning.     Limitations  Standing;Walking;House hold activities    How long can you sit comfortably?  no issues    How long can you stand comfortably?  5-10 mins    How long can you walk comfortably?  varies, but can walk around her block in her neighborhood (1/4 mile)    Patient Stated Goals  walk without a limp and walk normally again    Currently in Pain?  Yes    Pain Score  5     Pain Location   Ankle    Pain Orientation  Right    Pain Descriptors / Indicators  Tightness         [facility] PT Assessment - 05/09/18 0001      ROM / Strength   AROM / PROM / Strength  PROM      AROM   Right Ankle Dorsiflexion  -10    Right Ankle Plantar Flexion  53    Right Ankle Inversion  15    Right Ankle Eversion  9      PROM   PROM Assessment Site  Ankle    Right Ankle Dorsiflexion  -7            [facility] Adult PT Treatment/Exercise - 05/09/18 0001      Exercises   Exercises  Ankle      Ankle Exercises: Stretches   Slant Board Stretch  3 reps;30 seconds    Other Stretch  R knee drives on 12" step 9X77" holds for DF      Ankle Exercises: Standing   BAPS  Standing;Level 2;10 reps    BAPS Limitations  DF/PF, inv/ev, CW/CCW    Rocker Board  2 minutes  Rocker Board Limitations  R/L, A/P x2 mins each for ankle mobility    Heel Raises  Both;20 reps    Toe Raise  20 reps    Tai Chi  MWM             PT Education - 05/09/18 1120    Education Details  exercise techique, added 4-way ankle to HEP    Person(s) Educated  Patient    Methods  Demonstration;Explanation    Comprehension  Verbalized understanding;Returned demonstration       PT Short Term Goals - 05/02/18 1052      PT SHORT TERM GOAL #1   Title  Pt will have improved R ankle AROM by 5 deg throughout in order to maximize gait.    Time  2    Period  Weeks    Status  On-going      PT SHORT TERM GOAL #2   Title  Pt will have 1/2 grade improvement throughout ankle MMT in order to maximize gait and balance.    Time  2    Period  Weeks    Status  On-going      PT SHORT TERM GOAL #3   Title  Pt will be able to perform R SLS for 10 sec or > to demo improved functional ankle strength and maximize gait.    Time  2    Period  Weeks    Status  On-going        PT Long Term Goals - 05/02/18 1052      PT LONG TERM GOAL #1   Title  Pt will have decreased edema of R ankle by 2cm in order to maximize AROM.    Time   4    Period  Weeks    Status  On-going      PT LONG TERM GOAL #2   Title  Pt will have improved R ankle DF to -5deg from neutral or better in order to further maximize gait and stair ambulation.    Time  4    Period  Weeks    Status  On-going      PT LONG TERM GOAL #3   Title  Pt will have at least 169f improvement in 3MWT with gait WFL in order to maximize her ability to RTW with minimal to no difficulties.    Time  4    Period  Weeks    Status  On-going      PT LONG TERM GOAL #4   Title  Pt will be able to perform R SLS for 20 sec or > to further demo improved functional ankle strength and maximize her stair ambulation.    Time  4    Period  Weeks    Status  On-going            Plan - 05/09/18 1209    Clinical Impression Statement  Continued with established POC focusing on R ankle mobility. Added standing BAPS and mobilization with movement (MWM) during knee drives for improved ankle ROM. Remeasured pt's AROM today and she has made progress throughout all ranges. PT able to get pt to -7deg PROM with noted hard end feel, likely indicating pt's maximum available DF ROM.     Rehab Potential  Good    PT Frequency  3x / week    PT Duration  4 weeks    PT Treatment/Interventions  ADLs/Self Care Home Management;Biofeedback;Cryotherapy;Electrical Stimulation;Moist Heat;Traction;Ultrasound;Gait training;Stair training;Functional mobility training;Therapeutic activities;Therapeutic exercise;Balance  training;Neuromuscular re-education;Patient/family education;Manual techniques;Orthotic Fit/Training;Scar mobilization;Compression bandaging;Passive range of motion;Dry needling;Taping    PT Next Visit Plan  add theraband strengthening into PF, DF, inv/ev likely seated; address limitations in AROM and overall strength as well as addressing joint mobility and swelling; add standing balance and strengthening as able.     PT Home Exercise Plan  eval: ABCs, standing calf stretch on step; 04/30/18  - towel slides/sweeps, standing calf stretch; 9/16: 1/2 kneel stretch for DF    Consulted and Agree with Plan of Care  Patient       Patient will benefit from skilled therapeutic intervention in order to improve the following deficits and impairments:  Abnormal gait, Decreased activity tolerance, Decreased balance, Decreased endurance, Decreased range of motion, Decreased strength, Difficulty walking, Hypomobility, Increased edema, Increased fascial restricitons, Increased muscle spasms, Impaired flexibility, Impaired sensation, Pain  Visit Diagnosis: Stiffness of right ankle, not elsewhere classified  Difficulty in walking, not elsewhere classified  Other abnormalities of gait and mobility  Muscle weakness (generalized)     Problem List Patient Active Problem List   Diagnosis Date Noted  . S/P ORIF (open reduction internal fixation) fracture right ankle 01/06/18 01/09/2018  . Closed trimalleolar fracture of right ankle   . Hyperlipidemia 07/05/2017  . Vitamin D deficiency 07/05/2017  . Essential hypertension 06/17/2017  . Post herpetic neuralgia 06/17/2017  . History of pulmonary embolism 06/17/2017       Brooke Powell PT, DPT  [facility] [address] [place], [place], [address] Phone: [phone]   Fax:  [phone]  Name: Denecia S Fallert MRN: [id number] Date of Birth: 09/20/1955   

## 2018-05-12 ENCOUNTER — Encounter (HOSPITAL_COMMUNITY): Payer: Self-pay

## 2018-05-12 ENCOUNTER — Ambulatory Visit (HOSPITAL_COMMUNITY): Payer: BLUE CROSS/BLUE SHIELD

## 2018-05-12 DIAGNOSIS — R2689 Other abnormalities of gait and mobility: Secondary | ICD-10-CM | POA: Diagnosis not present

## 2018-05-12 DIAGNOSIS — R262 Difficulty in walking, not elsewhere classified: Secondary | ICD-10-CM | POA: Diagnosis not present

## 2018-05-12 DIAGNOSIS — M6281 Muscle weakness (generalized): Secondary | ICD-10-CM | POA: Diagnosis not present

## 2018-05-12 DIAGNOSIS — M25671 Stiffness of right ankle, not elsewhere classified: Secondary | ICD-10-CM | POA: Diagnosis not present

## 2018-05-12 NOTE — Therapy (Signed)
Janice Gonzales, [place], [address] Phone: [phone]   Fax:  [phone]  Physical Therapy Treatment  Patient Details  Name: Janice Gonzales MRN: [id number] Date of Birth: 10-29-1955 Referring Provider: Arther Abbott, MD   Encounter Date: 05/12/2018  PT End of Session - 05/12/18 0947    Visit Number  7    Number of Visits  13    Date for PT Re-Evaluation  05/22/18    Authorization Type  BCBS Other    Authorization Time Period  04/24/18 to 05/22/18    Authorization - Visit Number  7    Authorization - Number of Visits  60   medical eligibility review after 25th visit   PT Start Time  0946    PT Stop Time  1029    PT Time Calculation (min)  43 min    Activity Tolerance  Patient tolerated treatment well;No increased pain    Behavior During Therapy  WFL for tasks assessed/performed       Past Medical History:  Diagnosis Date  . Arthritis   . Glaucoma   . History of DVT of lower extremity    with PE  . Hyperlipidemia 07/05/2017  . Hypertension   . Post herpetic neuralgia 06/17/2017  . Sleep apnea     Past Surgical History:  Procedure Laterality Date  . ORIF ANKLE FRACTURE Right 01/06/2018   Procedure: OPEN REDUCTION INTERNAL FIXATION (ORIF) RIGHT ANKLE FRACTURE;  Surgeon: Carole Civil, MD;  Location: [facility];  Service: Orthopedics;  Laterality: Right;    There were no vitals filed for this visit.  Subjective Assessment - 05/12/18 0947    Subjective  Pt reports that her R ankle is still stiff. She was up on it all day Saturday and by the end of the day it was hurting and swelling.    Limitations  Standing;Walking;House hold activities    How long can you sit comfortably?  no issues    How long can you stand comfortably?  5-10 mins    How long can you walk comfortably?  varies, but can walk around her block in her neighborhood (1/4 mile)    Patient Stated Goals  walk without a limp and walk normally  again    Currently in Pain?  Yes    Pain Score  5     Pain Location  Ankle    Pain Orientation  Right    Pain Descriptors / Indicators  Tightness               [facility] Adult PT Treatment/Exercise - 05/12/18 0001      Exercises   Exercises  Ankle      Manual Therapy   Manual Therapy  Joint mobilization    Manual therapy comments  Manual completed separate from all other exercises    Joint Mobilization  AP talocrural joint Grade III-IV 10x10-15" bouts; talocrural distractioon 5x15" holds      Ankle Exercises: Stretches   Slant Board Stretch  3 reps;30 seconds    Other Stretch  R knee drives on 12" step 0Y51" holds for DF      Ankle Exercises: Standing   BAPS  Standing;Level 2;10 reps    BAPS Limitations  DF/PF, inv/ev, CW/CCW    Rocker Board  2 minutes    Rocker Board Limitations  R/L, A/P x2 mins each for ankle mobility    Tai Chi  MWM 10x10" holds with belt on 12"  step      Ankle Exercises: Seated   Other Seated Ankle Exercises  4-way ankle RTB x15 reps each             PT Education - 05/12/18 0947    Education Details  exercise technique    Person(s) Educated  Patient    Methods  Explanation    Comprehension  Verbalized understanding       PT Short Term Goals - 05/02/18 1052      PT SHORT TERM GOAL #1   Title  Pt will have improved R ankle AROM by 5 deg throughout in order to maximize gait.    Time  2    Period  Weeks    Status  On-going      PT SHORT TERM GOAL #2   Title  Pt will have 1/2 grade improvement throughout ankle MMT in order to maximize gait and balance.    Time  2    Period  Weeks    Status  On-going      PT SHORT TERM GOAL #3   Title  Pt will be able to perform R SLS for 10 sec or > to demo improved functional ankle strength and maximize gait.    Time  2    Period  Weeks    Status  On-going        PT Long Term Goals - 05/02/18 1052      PT LONG TERM GOAL #1   Title  Pt will have decreased edema of R ankle by 2cm in order  to maximize AROM.    Time  4    Period  Weeks    Status  On-going      PT LONG TERM GOAL #2   Title  Pt will have improved R ankle DF to -5deg from neutral or better in order to further maximize gait and stair ambulation.    Time  4    Period  Weeks    Status  On-going      PT LONG TERM GOAL #3   Title  Pt will have at least 191f improvement in 3MWT with gait WFL in order to maximize her ability to RTW with minimal to no difficulties.    Time  4    Period  Weeks    Status  On-going      PT LONG TERM GOAL #4   Title  Pt will be able to perform R SLS for 20 sec or > to further demo improved functional ankle strength and maximize her stair ambulation.    Time  4    Period  Weeks    Status  On-going            Plan - 05/12/18 1030    Clinical Impression Statement  Continued with established POC focusing on R ankle mobility. Pt stating her ankle felt the best it had ever felt following the last session and attributed it to the MWM so continued that today. Continued with standing mobility work and ended with manual joint mobs to address lacking AROM. She still demos increased compensation at the R hip during rockerboard due to lack of R ankle ROM. Overall, pt making steady progress towards goals.  PROM with knee straight to -2deg today. AROM knee bent to -6deg.     Rehab Potential  Good    PT Frequency  3x / week    PT Duration  4 weeks    PT Treatment/Interventions  ADLs/Self Care Home Management;Biofeedback;Cryotherapy;Electrical Stimulation;Moist Heat;Traction;Ultrasound;Gait training;Stair training;Functional mobility training;Therapeutic activities;Therapeutic exercise;Balance training;Neuromuscular re-education;Patient/family education;Manual techniques;Orthotic Fit/Training;Scar mobilization;Compression bandaging;Passive range of motion;Dry needling;Taping    PT Next Visit Plan  continue to address limitations in AROM and overall strength as well as addressing joint mobility and  swelling; add standing balance and strengthening as able.     PT Home Exercise Plan  eval: ABCs, standing calf stretch on step; 04/30/18 - towel slides/sweeps, standing calf stretch; 9/16: 1/2 kneel stretch for DF; 9/23: 4-way ankle seated    Consulted and Agree with Plan of Care  Patient       Patient will benefit from skilled therapeutic intervention in order to improve the following deficits and impairments:  Abnormal gait, Decreased activity tolerance, Decreased balance, Decreased endurance, Decreased range of motion, Decreased strength, Difficulty walking, Hypomobility, Increased edema, Increased fascial restricitons, Increased muscle spasms, Impaired flexibility, Impaired sensation, Pain  Visit Diagnosis: Stiffness of right ankle, not elsewhere classified  Difficulty in walking, not elsewhere classified  Other abnormalities of gait and mobility  Muscle weakness (generalized)     Problem List Patient Active Problem List   Diagnosis Date Noted  . S/P ORIF (open reduction internal fixation) fracture right ankle 01/06/18 01/09/2018  . Closed trimalleolar fracture of right ankle   . Hyperlipidemia 07/05/2017  . Vitamin D deficiency 07/05/2017  . Essential hypertension 06/17/2017  . Post herpetic neuralgia 06/17/2017  . History of pulmonary embolism 06/17/2017        Geraldine Solar PT, DPT  [facility] [address] [place], [place], [address] Phone: [phone]   Fax:  [phone]  Name: RUTHIE BERCH MRN: [id number] Date of Birth: April 26, 1956

## 2018-05-14 ENCOUNTER — Ambulatory Visit (HOSPITAL_COMMUNITY): Payer: BLUE CROSS/BLUE SHIELD

## 2018-05-14 ENCOUNTER — Encounter: Payer: Self-pay | Admitting: Orthopedic Surgery

## 2018-05-14 ENCOUNTER — Encounter (HOSPITAL_COMMUNITY): Payer: Self-pay

## 2018-05-14 ENCOUNTER — Telehealth: Payer: Self-pay | Admitting: Orthopedic Surgery

## 2018-05-14 DIAGNOSIS — R2689 Other abnormalities of gait and mobility: Secondary | ICD-10-CM

## 2018-05-14 DIAGNOSIS — M6281 Muscle weakness (generalized): Secondary | ICD-10-CM | POA: Diagnosis not present

## 2018-05-14 DIAGNOSIS — M25671 Stiffness of right ankle, not elsewhere classified: Secondary | ICD-10-CM

## 2018-05-14 DIAGNOSIS — R262 Difficulty in walking, not elsewhere classified: Secondary | ICD-10-CM | POA: Diagnosis not present

## 2018-05-14 NOTE — Therapy (Signed)
[facility] [place], [place], [address] Phone: [phone]   Fax:  [phone]  Physical Therapy Treatment  Patient Details  Name: Janice Gonzales MRN: [id number] Date of Birth: 1956-01-17 Referring Provider: Arther Abbott, MD   Encounter Date: 05/14/2018  PT End of Session - 05/14/18 1045    Visit Number  8    Number of Visits  13    Date for PT Re-Evaluation  05/22/18    Authorization Type  BCBS Other    Authorization Time Period  04/24/18 to 05/22/18    Authorization - Visit Number  8    Authorization - Number of Visits  60   medical eligibility review after 25th visit   PT Start Time  1034    PT Stop Time  1115    PT Time Calculation (min)  41 min    Activity Tolerance  Patient tolerated treatment well;No increased pain    Behavior During Therapy  WFL for tasks assessed/performed       Past Medical History:  Diagnosis Date  . Arthritis   . Glaucoma   . History of DVT of lower extremity    with PE  . Hyperlipidemia 07/05/2017  . Hypertension   . Post herpetic neuralgia 06/17/2017  . Sleep apnea     Past Surgical History:  Procedure Laterality Date  . ORIF ANKLE FRACTURE Right 01/06/2018   Procedure: OPEN REDUCTION INTERNAL FIXATION (ORIF) RIGHT ANKLE FRACTURE;  Surgeon: Carole Civil, MD;  Location: [facility];  Service: Orthopedics;  Laterality: Right;    There were no vitals filed for this visit.  Subjective Assessment - 05/14/18 1036    Subjective  Pt stated her ankle is stiff this morning, no real pain unless she has been standing for long periods of time.  Thinking of returning to work on Monday, plan to go by MD office to see if able to return with shorter hours.      Patient Stated Goals  walk without a limp and walk normally again    Currently in Pain?  No/denies    Pain Descriptors / Indicators  Tightness                       [facility] Adult PT Treatment/Exercise - 05/14/18  0001      Manual Therapy   Manual Therapy  Joint mobilization    Manual therapy comments  Manual completed separate from all other exercises    Joint Mobilization  AP talocrural joint Grade III-IV 10x10-15" bouts; talocrural distractioon 5x15" holds      Ankle Exercises: Standing   BAPS  Standing;Level 2;10 reps    BAPS Limitations  DF/PF, inv/ev, CW/CCW    SLS  Rt 36", Lt 6" max of 3    Rocker Board  2 minutes    Rocker Board Limitations  R/L, A/P x2 mins each for ankle mobility    Tai Chi  MWM 10x10" holds with belt on 12" step    Other Standing Ankle Exercises  tandem stance 2x 30" 1st on static surface 2nd on foam      Ankle Exercises: Stretches   Slant Board Stretch  3 reps;30 seconds    Other Stretch  R knee drives on 12" step 8K99" holds for DF               PT Short Term Goals - 05/02/18 1052      PT SHORT TERM GOAL #  1   Title  Pt will have improved R ankle AROM by 5 deg throughout in order to maximize gait.    Time  2    Period  Weeks    Status  On-going      PT SHORT TERM GOAL #2   Title  Pt will have 1/2 grade improvement throughout ankle MMT in order to maximize gait and balance.    Time  2    Period  Weeks    Status  On-going      PT SHORT TERM GOAL #3   Title  Pt will be able to perform R SLS for 10 sec or > to demo improved functional ankle strength and maximize gait.    Time  2    Period  Weeks    Status  On-going        PT Long Term Goals - 05/02/18 1052      PT LONG TERM GOAL #1   Title  Pt will have decreased edema of R ankle by 2cm in order to maximize AROM.    Time  4    Period  Weeks    Status  On-going      PT LONG TERM GOAL #2   Title  Pt will have improved R ankle DF to -5deg from neutral or better in order to further maximize gait and stair ambulation.    Time  4    Period  Weeks    Status  On-going      PT LONG TERM GOAL #3   Title  Pt will have at least 18f improvement in 3MWT with gait WFL in order to maximize her  ability to RTW with minimal to no difficulties.    Time  4    Period  Weeks    Status  On-going      PT LONG TERM GOAL #4   Title  Pt will be able to perform R SLS for 20 sec or > to further demo improved functional ankle strength and maximize her stair ambulation.    Time  4    Period  Weeks    Status  On-going            Plan - 05/14/18 1126    Clinical Impression Statement  Continued wiht established POC focusing on Rt ankle mobility and additional balalnce training.  Pt continues to demonstrate ankle mobility deficits especially with dorsiflexion, increased difficulty with SLS due to ankle mobilty and hip stabiliity.  Manual joint mobs complete to address these impairments.  No reports of increased pain through sessoin.      Rehab Potential  Good    PT Frequency  3x / week    PT Duration  4 weeks    PT Treatment/Interventions  ADLs/Self Care Home Management;Biofeedback;Cryotherapy;Electrical Stimulation;Moist Heat;Traction;Ultrasound;Gait training;Stair training;Functional mobility training;Therapeutic activities;Therapeutic exercise;Balance training;Neuromuscular re-education;Patient/family education;Manual techniques;Orthotic Fit/Training;Scar mobilization;Compression bandaging;Passive range of motion;Dry needling;Taping    PT Next Visit Plan  continue to address limitations in AROM and overall strength as well as addressing joint mobility and swelling; continue with standing balance and strengthening as able.     PT Home Exercise Plan  eval: ABCs, standing calf stretch on step; 04/30/18 - towel slides/sweeps, standing calf stretch; 9/16: 1/2 kneel stretch for DF; 9/23: 4-way ankle seated       Patient will benefit from skilled therapeutic intervention in order to improve the following deficits and impairments:  Abnormal gait, Decreased activity tolerance, Decreased balance, Decreased endurance, Decreased range  of motion, Decreased strength, Difficulty walking, Hypomobility,  Increased edema, Increased fascial restricitons, Increased muscle spasms, Impaired flexibility, Impaired sensation, Pain  Visit Diagnosis: Stiffness of right ankle, not elsewhere classified  Difficulty in walking, not elsewhere classified  Other abnormalities of gait and mobility  Muscle weakness (generalized)     Problem List Patient Active Problem List   Diagnosis Date Noted  . S/P ORIF (open reduction internal fixation) fracture right ankle 01/06/18 01/09/2018  . Closed trimalleolar fracture of right ankle   . Hyperlipidemia 07/05/2017  . Vitamin D deficiency 07/05/2017  . Essential hypertension 06/17/2017  . Post herpetic neuralgia 06/17/2017  . History of pulmonary embolism 06/17/2017   Ihor Austin, LPTA; [place]  Aldona Lento 05/14/2018, 12:00 PM  [facility] [address] [place], [place], [address] Phone: [phone]   Fax:  [phone]  Name: Janice Gonzales MRN: [id number] Date of Birth: 10-01-1955

## 2018-05-14 NOTE — Telephone Encounter (Signed)
Spoke with patient; note issued per Dr Mort Sawyers response. Aware ready for pick-up; aware it has been faxed to her short-term disability insurer per request; authorization attached.

## 2018-05-14 NOTE — Telephone Encounter (Signed)
yes

## 2018-05-14 NOTE — Telephone Encounter (Signed)
Patient's work note had most recently been revised for her return to work 05/19/18. May she return for 4 hour shifts for her first week, then full time/full duty, no restrictions by 05/26/18?  Patient's next scheduled appointment is 07/16/18. Please advise.

## 2018-05-15 ENCOUNTER — Telehealth (HOSPITAL_COMMUNITY): Payer: Self-pay

## 2018-05-15 NOTE — Telephone Encounter (Signed)
Faxed pt's records to [facility] [employer] @Fax : [phone]. Pt will pick these copies up at her next appointment. Package in hold-drawer. NF 05/15/18

## 2018-05-16 ENCOUNTER — Encounter (HOSPITAL_COMMUNITY): Payer: Self-pay

## 2018-05-16 ENCOUNTER — Ambulatory Visit (HOSPITAL_COMMUNITY): Payer: BLUE CROSS/BLUE SHIELD

## 2018-05-16 DIAGNOSIS — R262 Difficulty in walking, not elsewhere classified: Secondary | ICD-10-CM | POA: Diagnosis not present

## 2018-05-16 DIAGNOSIS — M25671 Stiffness of right ankle, not elsewhere classified: Secondary | ICD-10-CM

## 2018-05-16 DIAGNOSIS — M6281 Muscle weakness (generalized): Secondary | ICD-10-CM

## 2018-05-16 DIAGNOSIS — R2689 Other abnormalities of gait and mobility: Secondary | ICD-10-CM

## 2018-05-16 NOTE — Therapy (Signed)
[facility] [place], [place], [address] Phone: [phone]   Fax:  [phone]  Physical Therapy Treatment  Patient Details  Name: Janice Gonzales MRN: [id number] Date of Birth: 01/14/1956 Referring Provider (PT): Arther Abbott, MD   Encounter Date: 05/16/2018  PT End of Session - 05/16/18 0959    Visit Number  9    Number of Visits  13    Date for PT Re-Evaluation  05/22/18    Authorization Type  BC[employer] Other    Authorization Time Period  04/24/18 to 05/22/18    Authorization - Visit Number  9    Authorization - Number of Visits  60   medical eligibility review after 25th visit   PT Start Time  0955    PT Stop Time  1038    PT Time Calculation (min)  43 min    Activity Tolerance  Patient tolerated treatment well;No increased pain    Behavior During Therapy  WFL for tasks assessed/performed       Past Medical History:  Diagnosis Date  . Arthritis   . Glaucoma   . History of DVT of lower extremity    with PE  . Hyperlipidemia 07/05/2017  . Hypertension   . Post herpetic neuralgia 06/17/2017  . Sleep apnea     Past Surgical History:  Procedure Laterality Date  . ORIF ANKLE FRACTURE Right 01/06/2018   Procedure: OPEN REDUCTION INTERNAL FIXATION (ORIF) RIGHT ANKLE FRACTURE;  Surgeon: Carole Civil, MD;  Location: [facility];  Service: Orthopedics;  Laterality: Right;    There were no vitals filed for this visit.  Subjective Assessment - 05/16/18 0958    Subjective  Still stiff in the morning and when getting up to go to the bathroom in the middle of the night. Performs ABC or ankle up and downs to loosen up ankle before walking. Returning to work next Monday 4 hrs/day as a trial. Bought new shoes that are very padded and comfortable should work well working on [employer], [employer].     Patient Stated Goals  walk without a limp and walk normally again    Currently in Pain?  Yes    Pain Score  2     Pain Location  Knee    Pain Orientation  Right;Anterior;Medial    Pain Descriptors / Indicators  Tightness                       [facility] Adult PT Treatment/Exercise - 05/16/18 0001      Exercises   Exercises  Ankle      Ankle Exercises: Standing   BAPS  Standing;Level 1;10 reps    BAPS Limitations  DF/PF, inv/ev, CW/CCW    SLS  Rt 9 sec, Lt 60 sec, max onf 3 trials    Rocker Board  2 minutes    Rocker Board Limitations  R/L, A/P x2 mins each for ankle mobility    Tai Chi  MWM 10x10" holds with belt on 12" step    Other Standing Ankle Exercises  tandem stance 2x 30" foam      Ankle Exercises: Stretches   Slant Board Stretch  3 reps;30 seconds    Other Stretch  R knee drives on 12" step 3I14" holds for DF      Ankle Exercises: Supine   T-Band  Pf, DF, inv, ever BTB x10 to review HEP  PT Short Term Goals - 05/16/18 1025      PT SHORT TERM GOAL #1   Title  Pt will have improved R ankle AROM by 5 deg throughout in order to maximize gait.    Time  2    Period  Weeks    Status  On-going      PT SHORT TERM GOAL #2   Title  Pt will have 1/2 grade improvement throughout ankle MMT in order to maximize gait and balance.    Time  2    Period  Weeks    Status  On-going      PT SHORT TERM GOAL #3   Title  Pt will be able to perform R SLS for 10 sec or > to demo improved functional ankle strength and maximize gait.    Time  2    Period  Weeks    Status  On-going        PT Long Term Goals - 05/16/18 1040      PT LONG TERM GOAL #1   Title  Pt will have decreased edema of R ankle by 2cm in order to maximize AROM.    Time  4    Period  Weeks    Status  On-going      PT LONG TERM GOAL #2   Title  Pt will have improved R ankle DF to -5deg from neutral or better in order to further maximize gait and stair ambulation.    Time  4    Period  Weeks    Status  On-going      PT LONG TERM GOAL #3   Title  Pt will have at least 116f improvement in 3MWT with gait WFL in  order to maximize her ability to RTW with minimal to no difficulties.    Time  4    Period  Weeks    Status  On-going      PT LONG TERM GOAL #4   Title  Pt will be able to perform R SLS for 20 sec or > to further demo improved functional ankle strength and maximize her stair ambulation.    Time  4    Period  Weeks    Status  On-going            Plan - 05/16/18 1000    Clinical Impression Statement  Continued with established POC focusing on Rt ankle mobility and balance training. Patient continues to demonstrate ankle mobility deficits especially with dorsiflexion, increased difficulty with SLS (with left ankle improvements noted today) due to right ankle mobility and hip instability. No reports of increased pain through session. Patient requested to review therband exercises from HEP to ensure she was performing them correctly. Verbal and tactile cues required for correct performance.    Rehab Potential  Good    PT Frequency  3x / week    PT Duration  4 weeks    PT Treatment/Interventions  ADLs/Self Care Home Management;Biofeedback;Cryotherapy;Electrical Stimulation;Moist Heat;Traction;Ultrasound;Gait training;Stair training;Functional mobility training;Therapeutic activities;Therapeutic exercise;Balance training;Neuromuscular re-education;Patient/family education;Manual techniques;Orthotic Fit/Training;Scar mobilization;Compression bandaging;Passive range of motion;Dry needling;Taping    PT Next Visit Plan  Re-eval by 05/22/2018. continue to address limitations in AROM and overall strength as well as addressing joint mobility and swelling; continue with standing balance and strengthening as able. Ensure correct performance of therband HEP.    PT Home Exercise Plan  eval: ABCs, standing calf stretch on step; 04/30/18 - towel slides/sweeps, standing calf stretch; 9/16: 1/2 kneel stretch  for DF; 9/23: 4-way ankle seated       Patient will benefit from skilled therapeutic intervention in  order to improve the following deficits and impairments:  Abnormal gait, Decreased activity tolerance, Decreased balance, Decreased endurance, Decreased range of motion, Decreased strength, Difficulty walking, Hypomobility, Increased edema, Increased fascial restricitons, Increased muscle spasms, Impaired flexibility, Impaired sensation, Pain  Visit Diagnosis: Stiffness of right ankle, not elsewhere classified  Difficulty in walking, not elsewhere classified  Other abnormalities of gait and mobility  Muscle weakness (generalized)     Problem List Patient Active Problem List   Diagnosis Date Noted  . S/P ORIF (open reduction internal fixation) fracture right ankle 01/06/18 01/09/2018  . Closed trimalleolar fracture of right ankle   . Hyperlipidemia 07/05/2017  . Vitamin D deficiency 07/05/2017  . Essential hypertension 06/17/2017  . Post herpetic neuralgia 06/17/2017  . History of pulmonary embolism 06/17/2017    Floria Raveling. Hartnett-Rands, MS, PT [facility] #[id number] 05/16/2018, 10:48 AM  [facility] [address] [place], [place], [address] Phone: [phone]   Fax:  [phone]  Name: Janice Gonzales MRN: [id number] Date of Birth: 1956/07/30

## 2018-05-19 ENCOUNTER — Encounter (HOSPITAL_COMMUNITY): Payer: Self-pay

## 2018-05-19 ENCOUNTER — Other Ambulatory Visit: Payer: Self-pay

## 2018-05-19 ENCOUNTER — Ambulatory Visit (HOSPITAL_COMMUNITY): Payer: BLUE CROSS/BLUE SHIELD

## 2018-05-19 DIAGNOSIS — M6281 Muscle weakness (generalized): Secondary | ICD-10-CM

## 2018-05-19 DIAGNOSIS — R262 Difficulty in walking, not elsewhere classified: Secondary | ICD-10-CM

## 2018-05-19 DIAGNOSIS — R2689 Other abnormalities of gait and mobility: Secondary | ICD-10-CM | POA: Diagnosis not present

## 2018-05-19 DIAGNOSIS — M25671 Stiffness of right ankle, not elsewhere classified: Secondary | ICD-10-CM

## 2018-05-19 NOTE — Therapy (Signed)
[facility] [address] [place], [place], [address] Phone: [phone]   Fax:  [phone]  Physical Therapy Treatment  Patient Details  Name: Janice Gonzales MRN: [id number] Date of Birth: Apr 28, 1956 Referring Provider (PT): Fuller Canada, MD   Encounter Date: 05/19/2018  PT End of Session - 05/19/18 0909    Visit Number  10    Number of Visits  13    Date for PT Re-Evaluation  05/22/18    Authorization Type  BCBS Other    Authorization Time Period  04/24/18 to 05/22/18    Authorization - Visit Number  10    Authorization - Number of Visits  60   medical eligibility review after 25th visit   PT Start Time  0905    PT Stop Time  0949    PT Time Calculation (min)  44 min    Activity Tolerance  Patient tolerated treatment well;No increased pain    Behavior During Therapy  WFL for tasks assessed/performed       Past Medical History:  Diagnosis Date  . Arthritis   . Glaucoma   . History of DVT of lower extremity    with PE  . Hyperlipidemia 07/05/2017  . Hypertension   . Post herpetic neuralgia 06/17/2017  . Sleep apnea     Past Surgical History:  Procedure Laterality Date  . ORIF ANKLE FRACTURE Right 01/06/2018   Procedure: OPEN REDUCTION INTERNAL FIXATION (ORIF) RIGHT ANKLE FRACTURE;  Surgeon: Vickki Hearing, MD;  Location: [facility];  Service: Orthopedics;  Laterality: Right;    There were no vitals filed for this visit.  Subjective Assessment - 05/19/18 0908    Subjective  Patient reports her ankle is a little stiff but does not hurt. She reports she is returning to work today and will start with part time days.     Limitations  Standing;Walking;House hold activities    Patient Stated Goals  walk without a limp and walk normally again    Currently in Pain?  No/denies        [facility] Adult PT Treatment/Exercise - 05/19/18 0001      Ankle Exercises: Standing   BAPS  Standing;Level 2;15 reps;Limitations    BAPS  Limitations  DF/PF, inv/ev, CW/CCW    SLS  SLS with vectors: 3 way, 3 sec holds, on foam, 10 reps bil LE    Rocker Board  2 minutes    Rocker Board Limitations  1x 2 minutes lateral and ant/post for mobility    Heel Raises  Both;20 reps   on incline   Toe Raise  20 reps   on decline   Other Standing Ankle Exercises  Forward lunge on 12" box: MWM to talocrural joint, grade 4 from therapist, 10x 10 seconds      Ankle Exercises: Seated   Other Seated Ankle Exercises  rockerboard: inversion/eversion ROM 2x 20 reps each direction Rt LE      Ankle Exercises: Stretches   Soleus Stretch  3 reps;30 seconds;Limitations    Soleus Stretch Limitations  sland board    Gastroc Stretch  3 reps;30 seconds;Limitations    Gastroc Stretch Limitations  slant board    Slant Board Stretch  --    Other Stretch  R knee drives on 12" step 3x30" holds for DF        PT Education - 05/19/18 0940    Education Details  Educated on exercises throughout session and provided ces for proper form.  Person(s) Educated  Patient    Methods  Explanation;Verbal cues    Comprehension  Verbalized understanding;Returned demonstration       PT Short Term Goals - 05/16/18 1025      PT SHORT TERM GOAL #1   Title  Pt will have improved R ankle AROM by 5 deg throughout in order to maximize gait.    Time  2    Period  Weeks    Status  On-going      PT SHORT TERM GOAL #2   Title  Pt will have 1/2 grade improvement throughout ankle MMT in order to maximize gait and balance.    Time  2    Period  Weeks    Status  On-going      PT SHORT TERM GOAL #3   Title  Pt will be able to perform R SLS for 10 sec or > to demo improved functional ankle strength and maximize gait.    Time  2    Period  Weeks    Status  On-going        PT Long Term Goals - 05/16/18 1040      PT LONG TERM GOAL #1   Title  Pt will have decreased edema of R ankle by 2cm in order to maximize AROM.    Time  4    Period  Weeks    Status   On-going      PT LONG TERM GOAL #2   Title  Pt will have improved R ankle DF to -5deg from neutral or better in order to further maximize gait and stair ambulation.    Time  4    Period  Weeks    Status  On-going      PT LONG TERM GOAL #3   Title  Pt will have at least 125ft improvement in with gait WFL in order to maximize her ability to RTW with minimal to no difficulties.    Time  4    Period  Weeks    Status  On-going      PT LONG TERM GOAL #4   Title  Pt will be able to perform R SLS for 20 sec or > to further demo improved functional ankle strength and maximize her stair ambulation.    Time  4    Period  Weeks    Status  On-going        Plan - 05/19/18 0911    Clinical Impression Statement  Session remained focused on Rt ankle mobility and balance/proprioception training. She advanced BAPS training to level 2 this session with good control for all planes of movement. She had the greatest difficulty with dorsiflexion and eversion. She reported some discomfort with combined movements in clockwise/counterclockwise motion on BAPS today. She performed gastric/soleus stretching and appears to have greater limitation with soleus length on Rt LE. She will continue to benefit from skilled PT interventions to address impairments and improve mobility as she returns to work.     Rehab Potential  Good    PT Frequency  3x / week    PT Duration  4 weeks    PT Treatment/Interventions  ADLs/Self Care Home Management;Biofeedback;Cryotherapy;Electrical Stimulation;Moist Heat;Traction;Ultrasound;Gait training;Stair training;Functional mobility training;Therapeutic activities;Therapeutic exercise;Balance training;Neuromuscular re-education;Patient/family education;Manual techniques;Orthotic Fit/Training;Scar mobilization;Compression bandaging;Passive range of motion;Dry needling;Taping    PT Next Visit Plan  Re-eval by 05/22/2018. continue to address limitations in AROM and overall strength as  well as addressing joint mobility. Ensure correct performance of  therband HEP. Add soleus stretch on step with knee bent for Rt LE.     PT Home Exercise Plan  eval: ABCs, standing calf stretch on step; 04/30/18 - towel slides/sweeps, standing calf stretch; 9/16: 1/2 kneel stretch for DF; 9/23: 4-way ankle seated    Consulted and Agree with Plan of Care  Patient       Patient will benefit from skilled therapeutic intervention in order to improve the following deficits and impairments:  Abnormal gait, Decreased activity tolerance, Decreased balance, Decreased endurance, Decreased range of motion, Decreased strength, Difficulty walking, Hypomobility, Increased edema, Increased fascial restricitons, Increased muscle spasms, Impaired flexibility, Impaired sensation, Pain  Visit Diagnosis: Stiffness of right ankle, not elsewhere classified  Difficulty in walking, not elsewhere classified  Other abnormalities of gait and mobility  Muscle weakness (generalized)     Problem List Patient Active Problem List   Diagnosis Date Noted  . S/P ORIF (open reduction internal fixation) fracture right ankle 01/06/18 01/09/2018  . Closed trimalleolar fracture of right ankle   . Hyperlipidemia 07/05/2017  . Vitamin D deficiency 07/05/2017  . Essential hypertension 06/17/2017  . Post herpetic neuralgia 06/17/2017  . History of pulmonary embolism 06/17/2017    Valentino Saxon, PT, DPT Physical Therapist with [facility]  05/19/2018 12:12 PM     [facility] [address] [place], [place], [address] Phone: [phone]   Fax:  [phone]  Name: Janice Gonzales MRN: [id number] Date of Birth: 03-08-1956

## 2018-05-21 ENCOUNTER — Ambulatory Visit (HOSPITAL_COMMUNITY): Payer: BLUE CROSS/BLUE SHIELD | Attending: Orthopedic Surgery

## 2018-05-21 ENCOUNTER — Encounter (HOSPITAL_COMMUNITY): Payer: Self-pay

## 2018-05-21 DIAGNOSIS — R2689 Other abnormalities of gait and mobility: Secondary | ICD-10-CM | POA: Diagnosis not present

## 2018-05-21 DIAGNOSIS — M25671 Stiffness of right ankle, not elsewhere classified: Secondary | ICD-10-CM | POA: Insufficient documentation

## 2018-05-21 DIAGNOSIS — M6281 Muscle weakness (generalized): Secondary | ICD-10-CM | POA: Diagnosis not present

## 2018-05-21 DIAGNOSIS — R262 Difficulty in walking, not elsewhere classified: Secondary | ICD-10-CM | POA: Insufficient documentation

## 2018-05-21 NOTE — Therapy (Signed)
[place], [place], [address] Phone: [phone]   Fax:  [phone]   Progress Note Reporting Period 05/02/18 to 05/21/18  See note below for Objective Data and Assessment of Progress/Goals.    Physical Therapy Treatment  Patient Details  Name: Janice Gonzales MRN: [id number] Date of Birth: Oct 29, 1955 Referring Provider (PT): Arther Abbott, MD   Encounter Date: 05/21/2018  PT End of Session - 05/21/18 0948    Visit Number  11    Number of Visits  21    Date for PT Re-Evaluation  06/18/18    Authorization Type  BCBS Other    Authorization Time Period  04/24/18 to 05/22/18; NEW: 05/21/18 to 06/18/18    Authorization - Visit Number  11    Authorization - Number of Visits  60   medical eligibility review after 25th visit   PT Start Time  0946    PT Stop Time  1028    PT Time Calculation (min)  42 min    Activity Tolerance  Patient tolerated treatment well;No increased pain    Behavior During Therapy  WFL for tasks assessed/performed       Past Medical History:  Diagnosis Date  . Arthritis   . Glaucoma   . History of DVT of lower extremity    with PE  . Hyperlipidemia 07/05/2017  . Hypertension   . Post herpetic neuralgia 06/17/2017  . Sleep apnea     Past Surgical History:  Procedure Laterality Date  . ORIF ANKLE FRACTURE Right 01/06/2018   Procedure: OPEN REDUCTION INTERNAL FIXATION (ORIF) RIGHT ANKLE FRACTURE;  Surgeon: Carole Civil, MD;  Location: [facility];  Service: Orthopedics;  Laterality: Right;    There were no vitals filed for this visit.  Subjective Assessment - 05/21/18 0949    Subjective  Pt reports that work the first day was tough but yesterday wasn't that bad. She has a comparession garment that she is going to start wearing to assist with the swelling after work. She had some pain after about 1.5 hours of working. No pain currenlty, just stiff.     Limitations   Standing;Walking;House hold activities    Patient Stated Goals  walk without a limp and walk normally again    Currently in Pain?  No/denies         [facility] PT Assessment - 05/21/18 0001      Assessment   Medical Diagnosis  s/p ORIF R ankle    Referring Provider (PT)  Arther Abbott, MD    Onset Date/Surgical Date  01/06/18    Next MD Visit  07/16/18    Prior Therapy  none for current issues      Observation/Other Assessments-Edema    Edema  Figure 8      Figure 8 Edema   Figure 8 - Right   --   was 54.25cm     AROM   Right Ankle Dorsiflexion  -8   knee bent, -5knee straight; was -10 knee bent    Right Ankle Plantar Flexion  61   was 53   Right Ankle Inversion  16   was 15   Right Ankle Eversion  10   was 9     Strength   Right Ankle Dorsiflexion  4+/5   was 4   Right Ankle Eversion  5/5   was 4+   Left Ankle Dorsiflexion  5/5   was 4+     Ambulation/Gait  Ambulation Distance (Feet)  678 Feet   3MWT; was 439f   Assistive device  None    Gait Pattern  Step-through pattern   R foot ER     Balance   Balance Assessed  Yes      Static Standing Balance   Static Standing - Balance Support  No upper extremity supported    Static Standing Balance -  Activities   Single Leg Stance - Right Leg    Static Standing - Comment/# of Minutes  R: 5.05sec or <   was 2sec or <           [facility] Adult PT Treatment/Exercise - 05/21/18 0001      Manual Therapy   Manual Therapy  Soft tissue mobilization    Manual therapy comments  Manual completed separate from all other exercises    Soft tissue mobilization  STM to R soleus to decrease restrictions and improve ROM             PT Education - 05/21/18 0950    Education Details  reassessment findings    Person(s) Educated  Patient    Methods  Explanation;Demonstration    Comprehension  Verbalized understanding;Returned demonstration       PT Short Term Goals - 05/21/18 0951      PT SHORT TERM GOAL #1    Title  Pt will have improved R ankle AROM by 5 deg throughout in order to maximize gait.    Baseline  10/2: see AROM    Time  2    Period  Weeks    Status  Partially Met      PT SHORT TERM GOAL #2   Title  Pt will have 1/2 grade improvement throughout ankle MMT in order to maximize gait and balance.    Baseline  10/2: see MMT    Time  2    Period  Weeks    Status  Achieved      PT SHORT TERM GOAL #3   Title  Pt will be able to perform R SLS for 10 sec or > to demo improved functional ankle strength and maximize gait.    Baseline  10/2: 5sec or < on R    Time  2    Period  Weeks    Status  On-going        PT Long Term Goals - 05/21/18 0951      PT LONG TERM GOAL #1   Title  Pt will have decreased edema of R ankle by 2cm in order to maximize AROM.    Baseline  10/2: decreaesd by 1.75cm figure 8    Time  4    Period  Weeks    Status  Partially Met      PT LONG TERM GOAL #2   Title  Pt will have improved R ankle DF to -5deg from neutral or better in order to further maximize gait and stair ambulation.    Baseline  10/2: -8deg knee bent, -5deg knee straight    Time  4    Period  Weeks    Status  Partially Met      PT LONG TERM GOAL #3   Title  Pt will have at least 1019fimprovement in 3MWT with gait WFL in order to maximize her ability to RTW with minimal to no difficulties.    Baseline  10/2: improved to 67860fbut R foot ER continued throughout    Time  4  Period  Weeks    Status  Partially Met      PT LONG TERM GOAL #4   Title  Pt will be able to perform R SLS for 20 sec or > to further demo improved functional ankle strength and maximize her stair ambulation.    Baseline  10/2: 5sec or < on R    Time  4    Period  Weeks    Status  On-going            Plan - 05/21/18 1031    Clinical Impression Statement  PT reassessed pt's goals and outcome measures today. Pt has made great progress overall as illustrated above, however, she is still limited in her R  ankle AROM, mainly DF, as she was -8deg knee bent and -5deg knee straight. PT feels this indicates tight soleus which is limiting her ankle ROM when the knee is bent as the gastroc is in a shortened position already so will begin to really focus on this in order to further maximize her R ankle AROM. Her balance is still impaired and she has slight gait deviations due to lack of ankle ROM. Pt needs continued skilled PT intervention to address these remaining deficits in order to decrease swelling, stiffness, and maximize AROM and return to PLOF.     Rehab Potential  Good    PT Frequency  3x / week    PT Duration  4 weeks    PT Treatment/Interventions  ADLs/Self Care Home Management;Biofeedback;Cryotherapy;Electrical Stimulation;Moist Heat;Traction;Ultrasound;Gait training;Stair training;Functional mobility training;Therapeutic activities;Therapeutic exercise;Balance training;Neuromuscular re-education;Patient/family education;Manual techniques;Orthotic Fit/Training;Scar mobilization;Compression bandaging;Passive range of motion;Dry needling;Taping    PT Next Visit Plan  increase focus on soleus soft tissue restrictions; Add soleus stretch on step with knee bent for Rt LE. continue to address limitations in AROM and overall strength as well as addressing joint mobility. Ensure correct performance of therband HEP.    PT Home Exercise Plan  eval: ABCs, standing calf stretch on step; 04/30/18 - towel slides/sweeps, standing calf stretch; 9/16: 1/2 kneel stretch for DF; 9/23: 4-way ankle seated    Consulted and Agree with Plan of Care  Patient       Patient will benefit from skilled therapeutic intervention in order to improve the following deficits and impairments:  Abnormal gait, Decreased activity tolerance, Decreased balance, Decreased endurance, Decreased range of motion, Decreased strength, Difficulty walking, Hypomobility, Increased edema, Increased fascial restricitons, Increased muscle spasms, Impaired  flexibility, Impaired sensation, Pain  Visit Diagnosis: Stiffness of right ankle, not elsewhere classified - Plan: PT plan of care cert/re-cert  Difficulty in walking, not elsewhere classified - Plan: PT plan of care cert/re-cert  Other abnormalities of gait and mobility - Plan: PT plan of care cert/re-cert  Muscle weakness (generalized) - Plan: PT plan of care cert/re-cert     Problem List Patient Active Problem List   Diagnosis Date Noted  . S/P ORIF (open reduction internal fixation) fracture right ankle 01/06/18 01/09/2018  . Closed trimalleolar fracture of right ankle   . Hyperlipidemia 07/05/2017  . Vitamin D deficiency 07/05/2017  . Essential hypertension 06/17/2017  . Post herpetic neuralgia 06/17/2017  . History of pulmonary embolism 06/17/2017       Geraldine Solar PT, DPT  [facility] [address] [place], [place], [address] Phone: [phone]   Fax:  [phone]  Name: Janice Gonzales MRN: [id number] Date of Birth: 08-10-1956

## 2018-05-22 ENCOUNTER — Encounter (HOSPITAL_COMMUNITY): Payer: Self-pay

## 2018-05-22 ENCOUNTER — Ambulatory Visit (HOSPITAL_COMMUNITY): Payer: BLUE CROSS/BLUE SHIELD

## 2018-05-22 DIAGNOSIS — M25671 Stiffness of right ankle, not elsewhere classified: Secondary | ICD-10-CM | POA: Diagnosis not present

## 2018-05-22 DIAGNOSIS — M6281 Muscle weakness (generalized): Secondary | ICD-10-CM

## 2018-05-22 DIAGNOSIS — R262 Difficulty in walking, not elsewhere classified: Secondary | ICD-10-CM | POA: Diagnosis not present

## 2018-05-22 DIAGNOSIS — R2689 Other abnormalities of gait and mobility: Secondary | ICD-10-CM

## 2018-05-22 NOTE — Therapy (Signed)
[place], [place], [address] Phone: [phone]   Fax:  [phone]  Physical Therapy Treatment  Patient Details  Name: Janice Gonzales MRN: [id number] Date of Birth: 1956/04/16 Referring Provider (PT): Arther Abbott, MD   Encounter Date: 05/22/2018  PT End of Session - 05/22/18 0951    Visit Number  12    Number of Visits  21    Date for PT Re-Evaluation  06/18/18    Authorization Type  BC[employer] Other    Authorization Time Period  04/24/18 to 05/22/18; NEW: 05/21/18 to 06/18/18    Authorization - Visit Number  12    Authorization - Number of Visits  60   medical eligibility review after 25th visit   PT Start Time  0952   pt few mins late   PT Stop Time  1031    PT Time Calculation (min)  39 min    Activity Tolerance  Patient tolerated treatment well;No increased pain    Behavior During Therapy  WFL for tasks assessed/performed       Past Medical History:  Diagnosis Date  . Arthritis   . Glaucoma   . History of DVT of lower extremity    with PE  . Hyperlipidemia 07/05/2017  . Hypertension   . Post herpetic neuralgia 06/17/2017  . Sleep apnea     Past Surgical History:  Procedure Laterality Date  . ORIF ANKLE FRACTURE Right 01/06/2018   Procedure: OPEN REDUCTION INTERNAL FIXATION (ORIF) RIGHT ANKLE FRACTURE;  Surgeon: Carole Civil, MD;  Location: [facility];  Service: Orthopedics;  Laterality: Right;    There were no vitals filed for this visit.  Subjective Assessment - 05/22/18 0952    Subjective  Pt states that her calf is bruised some from the manual last session. Continued stiffness but overall good.     Limitations  Standing;Walking;House hold activities    Patient Stated Goals  walk without a limp and walk normally again    Currently in Pain?  No/denies             [facility] Adult PT Treatment/Exercise - 05/22/18 0001      Exercises   Exercises  Ankle      Manual Therapy   Manual  Therapy  Joint mobilization    Manual therapy comments  Manual completed separate from all other exercises    Joint Mobilization  AP talocrural joint Grade III-IV 10x10-15" bouts; talocrural distractioon 5x15" holds      Ankle Exercises: Stretches   Soleus Stretch  2 reps;30 seconds;Limitations    Soleus Stretch Limitations  RLE, runner's stretch knee bent    Gastroc Stretch  3 reps;30 seconds;Limitations    Gastroc Stretch Limitations  RLE only on step      Ankle Exercises: Standing   BAPS  Standing;Level 3;10 reps    BAPS Limitations  board inverted to increase DF; +inv/ev, CW/CCW    Heel Walk (Round Trip)  16f x2RT    Toe Walk (Round Trip)  366fx2RT    Balance Beam  fwd tandem x2RT             PT Education - 05/22/18 0952    Education Details  exercise technique, continue HEP    Person(s) Educated  Patient    Methods  Explanation;Demonstration    Comprehension  Verbalized understanding;Returned demonstration       PT Short Term Goals - 05/21/18 0951      PT  SHORT TERM GOAL #1   Title  Pt will have improved R ankle AROM by 5 deg throughout in order to maximize gait.    Baseline  10/2: see AROM    Time  2    Period  Weeks    Status  Partially Met      PT SHORT TERM GOAL #2   Title  Pt will have 1/2 grade improvement throughout ankle MMT in order to maximize gait and balance.    Baseline  10/2: see MMT    Time  2    Period  Weeks    Status  Achieved      PT SHORT TERM GOAL #3   Title  Pt will be able to perform R SLS for 10 sec or > to demo improved functional ankle strength and maximize gait.    Baseline  10/2: 5sec or < on R    Time  2    Period  Weeks    Status  On-going        PT Long Term Goals - 05/21/18 0951      PT LONG TERM GOAL #1   Title  Pt will have decreased edema of R ankle by 2cm in order to maximize AROM.    Baseline  10/2: decreaesd by 1.75cm figure 8    Time  4    Period  Weeks    Status  Partially Met      PT LONG TERM GOAL #2    Title  Pt will have improved R ankle DF to -5deg from neutral or better in order to further maximize gait and stair ambulation.    Baseline  10/2: -8deg knee bent, -5deg knee straight    Time  4    Period  Weeks    Status  Partially Met      PT LONG TERM GOAL #3   Title  Pt will have at least 19f improvement in 3MWT with gait WFL in order to maximize her ability to RTW with minimal to no difficulties.    Baseline  10/2: improved to 6741f but R foot ER continued throughout    Time  4    Period  Weeks    Status  Partially Met      PT LONG TERM GOAL #4   Title  Pt will be able to perform R SLS for 20 sec or > to further demo improved functional ankle strength and maximize her stair ambulation.    Baseline  10/2: 5sec or < on R    Time  4    Period  Weeks    Status  On-going            Plan - 05/22/18 1032    Clinical Impression Statement  Session slightly as pt arrived a few mins late for appointment. Continued with focus on soleus mobility and stretching in order to increase R ankle ROM with knee bent. Progressed BAPS to L3 in standing and inverted the board so that she could go through more DF range during. Pt challenged with toe walking due to functional weakness. Continued with dynamic stability work for increased strength and balance. Ended with manual for joint mobs. Continue as planned, progressing as able.     Rehab Potential  Good    PT Frequency  3x / week    PT Duration  4 weeks    PT Treatment/Interventions  ADLs/Self Care Home Management;Biofeedback;Cryotherapy;Electrical Stimulation;Moist Heat;Traction;Ultrasound;Gait training;Stair training;Functional mobility training;Therapeutic activities;Therapeutic exercise;Balance training;Neuromuscular  re-education;Patient/family education;Manual techniques;Orthotic Fit/Training;Scar mobilization;Compression bandaging;Passive range of motion;Dry needling;Taping    PT Next Visit Plan  increase focus on soleus soft tissue  restrictions; continue soleus stretch on step with knee bent for Rt LE. continue to address limitations in AROM and overall strength as well as addressing joint mobility. Ensure correct performance of therband HEP.    PT Home Exercise Plan  eval: ABCs, standing calf stretch on step; 04/30/18 - towel slides/sweeps, standing calf stretch; 9/16: 1/2 kneel stretch for DF; 9/23: 4-way ankle seated; 10/3: so.eus stretch at wall    Consulted and Agree with Plan of Care  Patient       Patient will benefit from skilled therapeutic intervention in order to improve the following deficits and impairments:  Abnormal gait, Decreased activity tolerance, Decreased balance, Decreased endurance, Decreased range of motion, Decreased strength, Difficulty walking, Hypomobility, Increased edema, Increased fascial restricitons, Increased muscle spasms, Impaired flexibility, Impaired sensation, Pain  Visit Diagnosis: Stiffness of right ankle, not elsewhere classified  Difficulty in walking, not elsewhere classified  Other abnormalities of gait and mobility  Muscle weakness (generalized)     Problem List Patient Active Problem List   Diagnosis Date Noted  . S/P ORIF (open reduction internal fixation) fracture right ankle 01/06/18 01/09/2018  . Closed trimalleolar fracture of right ankle   . Hyperlipidemia 07/05/2017  . Vitamin D deficiency 07/05/2017  . Essential hypertension 06/17/2017  . Post herpetic neuralgia 06/17/2017  . History of pulmonary embolism 06/17/2017       Geraldine Solar PT, DPT  [facility] [address] [place], [place], [address] Phone: [phone]   Fax:  [phone]  Name: ALEXANDRIA SHIFLETT MRN: [id number] Date of Birth: [birth year]

## 2018-05-28 ENCOUNTER — Ambulatory Visit (HOSPITAL_COMMUNITY): Payer: BLUE CROSS/BLUE SHIELD

## 2018-05-28 ENCOUNTER — Encounter (HOSPITAL_COMMUNITY): Payer: Self-pay

## 2018-05-28 DIAGNOSIS — R2689 Other abnormalities of gait and mobility: Secondary | ICD-10-CM | POA: Diagnosis not present

## 2018-05-28 DIAGNOSIS — R262 Difficulty in walking, not elsewhere classified: Secondary | ICD-10-CM | POA: Diagnosis not present

## 2018-05-28 DIAGNOSIS — M25671 Stiffness of right ankle, not elsewhere classified: Secondary | ICD-10-CM | POA: Diagnosis not present

## 2018-05-28 DIAGNOSIS — M6281 Muscle weakness (generalized): Secondary | ICD-10-CM | POA: Diagnosis not present

## 2018-05-28 NOTE — Therapy (Signed)
[place], [place], [address] Phone: [phone]   Fax:  [phone]  Physical Therapy Treatment  Patient Details  Name: Janice Gonzales MRN: [id number] Date of Birth: May 17, 1956 Referring Provider (PT): Arther Abbott, MD   Encounter Date: 05/28/2018  PT End of Session - 05/28/18 0953    Visit Number  13    Number of Visits  21    Date for PT Re-Evaluation  06/18/18    Authorization Type  [employer] Other    Authorization Time Period  04/24/18 to 05/22/18; NEW: 05/21/18 to 06/18/18    Authorization - Visit Number  13    Authorization - Number of Visits  60   medical eligibility review after 25th visit   PT Start Time  0950    PT Stop Time  1029    PT Time Calculation (min)  39 min    Activity Tolerance  Patient tolerated treatment well;No increased pain    Behavior During Therapy  WFL for tasks assessed/performed       Past Medical History:  Diagnosis Date  . Arthritis   . Glaucoma   . History of DVT of lower extremity    with PE  . Hyperlipidemia 07/05/2017  . Hypertension   . Post herpetic neuralgia 06/17/2017  . Sleep apnea     Past Surgical History:  Procedure Laterality Date  . ORIF ANKLE FRACTURE Right 01/06/2018   Procedure: OPEN REDUCTION INTERNAL FIXATION (ORIF) RIGHT ANKLE FRACTURE;  Surgeon: Carole Civil, MD;  Location: [facility];  Service: Orthopedics;  Laterality: Right;    There were no vitals filed for this visit.  Subjective Assessment - 05/28/18 0953    Subjective  Pt states that she worked two 8-hour shifts the last 2 days and her ankle is sore from that. She states that the feeling in her feet is steadily improving.     Limitations  Standing;Walking;House hold activities    Patient Stated Goals  walk without a limp and walk normally again    Currently in Pain?  No/denies         [facility] PT Assessment - 05/28/18 0001      ROM / Strength   AROM / PROM / Strength  AROM      AROM   Right Ankle Dorsiflexion  -5   knee bent pre-needling, -4deg post-needling            [facility] Adult PT Treatment/Exercise - 05/28/18 0001      Manual Therapy   Manual Therapy  Soft tissue mobilization    Manual therapy comments  Manual completed separate from all other exercises    Soft tissue mobilization  STM to R gastroc/solues complex to decrease restrictions and pain after needling to improve ROM      Ankle Exercises: Standing   Heel Raises  Left;20 reps       Trigger Point Dry Needling - 05/28/18 0954    Consent Given?  Yes    Education Handout Provided  Yes   provided last session   Muscles Treated Lower Body  Gastrocnemius;Soleus    Gastrocnemius Response  Twitch response elicited;Palpable increased muscle length   r in prone   Soleus Response  Twitch response elicited;Palpable increased muscle length   R in prone            PT Short Term Goals - 05/21/18 0951      PT SHORT TERM GOAL #1   Title  Pt will have improved R ankle AROM by 5 deg throughout in order to maximize gait.    Baseline  10/2: see AROM    Time  2    Period  Weeks    Status  Partially Met      PT SHORT TERM GOAL #2   Title  Pt will have 1/2 grade improvement throughout ankle MMT in order to maximize gait and balance.    Baseline  10/2: see MMT    Time  2    Period  Weeks    Status  Achieved      PT SHORT TERM GOAL #3   Title  Pt will be able to perform R SLS for 10 sec or > to demo improved functional ankle strength and maximize gait.    Baseline  10/2: 5sec or < on R    Time  2    Period  Weeks    Status  On-going        PT Long Term Goals - 05/21/18 0951      PT LONG TERM GOAL #1   Title  Pt will have decreased edema of R ankle by 2cm in order to maximize AROM.    Baseline  10/2: decreaesd by 1.75cm figure 8    Time  4    Period  Weeks    Status  Partially Met      PT LONG TERM GOAL #2   Title  Pt will have improved R ankle DF to -5deg from neutral or better  in order to further maximize gait and stair ambulation.    Baseline  10/2: -8deg knee bent, -5deg knee straight    Time  4    Period  Weeks    Status  Partially Met      PT LONG TERM GOAL #3   Title  Pt will have at least 152f improvement in 3MWT with gait WFL in order to maximize her ability to RTW with minimal to no difficulties.    Baseline  10/2: improved to 6751f but R foot ER continued throughout    Time  4    Period  Weeks    Status  Partially Met      PT LONG TERM GOAL #4   Title  Pt will be able to perform R SLS for 20 sec or > to further demo improved functional ankle strength and maximize her stair ambulation.    Baseline  10/2: 5sec or < on R    Time  4    Period  Weeks    Status  On-going            Plan - 05/28/18 1030    Clinical Impression Statement  Initiated trigger point dry needling to pt's R gastroc-soleus complex this date, mostly focusing on soleus. ROM measurements taken before and after; pre-needling her DF with knee bent was -5deg and post-needling, her DF with knee bent was -4deg. Followed up with STM and mm activation of R calf. PT educated pt that the post-needling soreness could be severe but that this is a normal response and should improve over next 24-48hrs. No pain or soreness at EOS    Rehab Potential  Good    PT Frequency  3x / week    PT Duration  4 weeks    PT Treatment/Interventions  ADLs/Self Care Home Management;Biofeedback;Cryotherapy;Electrical Stimulation;Moist Heat;Traction;Ultrasound;Gait training;Stair training;Functional mobility training;Therapeutic activities;Therapeutic exercise;Balance training;Neuromuscular re-education;Patient/family education;Manual techniques;Orthotic Fit/Training;Scar mobilization;Compression bandaging;Passive range of motion;Dry needling;Taping  PT Next Visit Plan  continue needling if +response; increase focus on soleus soft tissue restrictions; continue soleus stretch on step with knee bent for Rt LE.  continue to address limitations in AROM and overall strength as well as addressing joint mobility. Ensure correct performance of therband HEP.    PT Home Exercise Plan  eval: ABCs, standing calf stretch on step; 04/30/18 - towel slides/sweeps, standing calf stretch; 9/16: 1/2 kneel stretch for DF; 9/23: 4-way ankle seated; 10/3: so.eus stretch at wall    Consulted and Agree with Plan of Care  Patient       Patient will benefit from skilled therapeutic intervention in order to improve the following deficits and impairments:  Abnormal gait, Decreased activity tolerance, Decreased balance, Decreased endurance, Decreased range of motion, Decreased strength, Difficulty walking, Hypomobility, Increased edema, Increased fascial restricitons, Increased muscle spasms, Impaired flexibility, Impaired sensation, Pain  Visit Diagnosis: Stiffness of right ankle, not elsewhere classified  Difficulty in walking, not elsewhere classified  Other abnormalities of gait and mobility  Muscle weakness (generalized)     Problem List Patient Active Problem List   Diagnosis Date Noted  . S/P ORIF (open reduction internal fixation) fracture right ankle 01/06/18 01/09/2018  . Closed trimalleolar fracture of right ankle   . Hyperlipidemia 07/05/2017  . Vitamin D deficiency 07/05/2017  . Essential hypertension 06/17/2017  . Post herpetic neuralgia 06/17/2017  . History of pulmonary embolism 06/17/2017       Geraldine Solar PT, DPT  [facility] [address] [place], [place], [address] Phone: [phone]   Fax:  [phone]  Name: Janice Gonzales MRN: [id number] Date of Birth: 1956-01-11

## 2018-06-03 ENCOUNTER — Encounter: Payer: Self-pay | Admitting: Gastroenterology

## 2018-06-06 ENCOUNTER — Encounter

## 2018-06-10 ENCOUNTER — Encounter (HOSPITAL_COMMUNITY): Payer: Self-pay

## 2018-06-10 ENCOUNTER — Ambulatory Visit (HOSPITAL_COMMUNITY): Payer: BLUE CROSS/BLUE SHIELD

## 2018-06-10 DIAGNOSIS — R2689 Other abnormalities of gait and mobility: Secondary | ICD-10-CM

## 2018-06-10 DIAGNOSIS — M6281 Muscle weakness (generalized): Secondary | ICD-10-CM

## 2018-06-10 DIAGNOSIS — M25671 Stiffness of right ankle, not elsewhere classified: Secondary | ICD-10-CM | POA: Diagnosis not present

## 2018-06-10 DIAGNOSIS — R262 Difficulty in walking, not elsewhere classified: Secondary | ICD-10-CM | POA: Diagnosis not present

## 2018-06-10 NOTE — Therapy (Signed)
[place], [place], [address] Phone: [phone]   Fax:  [phone]  Physical Therapy Treatment  Patient Details  Name: Janice Gonzales MRN: [id number] Date of Birth: [birth year] Referring Provider (PT): Arther Abbott, MD   Encounter Date: 06/10/2018  PT End of Session - 06/10/18 1349    Visit Number  14    Number of Visits  21    Date for PT Re-Evaluation  06/18/18    Authorization Type  [employer]BS Other    Authorization Time Period  04/24/18 to 05/22/18; NEW: 05/21/18 to 06/18/18    Authorization - Visit Number  14    Authorization - Number of Visits  60   medical eligibility review after 25th visit   PT Start Time  2633    PT Stop Time  1429    PT Time Calculation (min)  42 min    Activity Tolerance  Patient tolerated treatment well;No increased pain    Behavior During Therapy  WFL for tasks assessed/performed       Past Medical History:  Diagnosis Date  . Arthritis   . Glaucoma   . History of DVT of lower extremity    with PE  . Hyperlipidemia 07/05/2017  . Hypertension   . Post herpetic neuralgia 06/17/2017  . Sleep apnea     Past Surgical History:  Procedure Laterality Date  . ORIF ANKLE FRACTURE Right 01/06/2018   Procedure: OPEN REDUCTION INTERNAL FIXATION (ORIF) RIGHT ANKLE FRACTURE;  Surgeon: Carole Civil, MD;  Location: [facility];  Service: Orthopedics;  Laterality: Right;    There were no vitals filed for this visit.  Subjective Assessment - 06/10/18 1349    Subjective  Pt states that her calf wasn't that sore following the needling. She has been working and her ankle has been holding up relatively well. She is able to take breaks throughout the day when it fatigues and she ices it when she gets home.     Limitations  Standing;Walking;House hold activities    Patient Stated Goals  walk without a limp and walk normally again    Currently in Pain?  No/denies         [facility] PT  Assessment - 06/10/18 0001      AROM   Right Ankle Dorsiflexion  -5   knee straight, -6 knee bent pre-DN; -3 straight, -5bent post          [facility] Adult PT Treatment/Exercise - 06/10/18 0001      Manual Therapy   Manual Therapy  Soft tissue mobilization    Manual therapy comments  Manual completed separate from all other exercises    Soft tissue mobilization  STM to R gastroc/solues complex to decrease restrictions and pain after needling to improve ROM       Trigger Point Dry Needling - 06/10/18 1358    Consent Given?  Yes    Education Handout Provided  No    Muscles Treated Lower Body  Gastrocnemius;Soleus    Gastrocnemius Response  Twitch response elicited;Palpable increased muscle length    Soleus Response  Twitch response elicited;Palpable increased muscle length             PT Short Term Goals - 05/21/18 0951      PT SHORT TERM GOAL #1   Title  Pt will have improved R ankle AROM by 5 deg throughout in order to maximize gait.    Baseline  10/2: see AROM  Time  2    Period  Weeks    Status  Partially Met      PT SHORT TERM GOAL #2   Title  Pt will have 1/2 grade improvement throughout ankle MMT in order to maximize gait and balance.    Baseline  10/2: see MMT    Time  2    Period  Weeks    Status  Achieved      PT SHORT TERM GOAL #3   Title  Pt will be able to perform R SLS for 10 sec or > to demo improved functional ankle strength and maximize gait.    Baseline  10/2: 5sec or < on R    Time  2    Period  Weeks    Status  On-going        PT Long Term Goals - 05/21/18 0951      PT LONG TERM GOAL #1   Title  Pt will have decreased edema of R ankle by 2cm in order to maximize AROM.    Baseline  10/2: decreaesd by 1.75cm figure 8    Time  4    Period  Weeks    Status  Partially Met      PT LONG TERM GOAL #2   Title  Pt will have improved R ankle DF to -5deg from neutral or better in order to further maximize gait and stair ambulation.     Baseline  10/2: -8deg knee bent, -5deg knee straight    Time  4    Period  Weeks    Status  Partially Met      PT LONG TERM GOAL #3   Title  Pt will have at least 161f improvement in 3MWT with gait WFL in order to maximize her ability to RTW with minimal to no difficulties.    Baseline  10/2: improved to 6754f but R foot ER continued throughout    Time  4    Period  Weeks    Status  Partially Met      PT LONG TERM GOAL #4   Title  Pt will be able to perform R SLS for 20 sec or > to further demo improved functional ankle strength and maximize her stair ambulation.    Baseline  10/2: 5sec or < on R    Time  4    Period  Weeks    Status  On-going            Plan - 06/10/18 1430    Clinical Impression Statement  Pt returns to therapy after almost 2 weeks out due to scheduling difficulty. Pt has lost 2deg of DF with knee bent as she was 6deg from neutral today and was 4deg from neutral at end of last session following needling. Performed trigger point dry needling again this date to the soleus and gastrocnemius in order to reduce restrictions and allow for improved ankle DF ROM. Followed up needling with STM to further reduce restrictions and improve ROM. AROM at EOS -5deg knee bent and -3deg knee straight. Continue as planned, progressing as able.     Rehab Potential  Good    PT Frequency  3x / week    PT Duration  4 weeks    PT Treatment/Interventions  ADLs/Self Care Home Management;Biofeedback;Cryotherapy;Electrical Stimulation;Moist Heat;Traction;Ultrasound;Gait training;Stair training;Functional mobility training;Therapeutic activities;Therapeutic exercise;Balance training;Neuromuscular re-education;Patient/family education;Manual techniques;Orthotic Fit/Training;Scar mobilization;Compression bandaging;Passive range of motion;Dry needling;Taping    PT Next Visit Plan  continue needling if +  response; increase focus on soleus soft tissue restrictions; continue soleus stretch on step  with knee bent for Rt LE. continue to address limitations in AROM and overall strength as well as addressing joint mobility. Ensure correct performance of therband HEP.    PT Home Exercise Plan  eval: ABCs, standing calf stretch on step; 04/30/18 - towel slides/sweeps, standing calf stretch; 9/16: 1/2 kneel stretch for DF; 9/23: 4-way ankle seated; 10/3: so.eus stretch at wall    Consulted and Agree with Plan of Care  Patient       Patient will benefit from skilled therapeutic intervention in order to improve the following deficits and impairments:  Abnormal gait, Decreased activity tolerance, Decreased balance, Decreased endurance, Decreased range of motion, Decreased strength, Difficulty walking, Hypomobility, Increased edema, Increased fascial restricitons, Increased muscle spasms, Impaired flexibility, Impaired sensation, Pain  Visit Diagnosis: Stiffness of right ankle, not elsewhere classified  Difficulty in walking, not elsewhere classified  Other abnormalities of gait and mobility  Muscle weakness (generalized)     Problem List Patient Active Problem List   Diagnosis Date Noted  . S/P ORIF (open reduction internal fixation) fracture right ankle 01/06/18 01/09/2018  . Closed trimalleolar fracture of right ankle   . Hyperlipidemia 07/05/2017  . Vitamin D deficiency 07/05/2017  . Essential hypertension 06/17/2017  . Post herpetic neuralgia 06/17/2017  . History of pulmonary embolism 06/17/2017      Geraldine Solar PT, DPT  [facility] [address] [place], [place], [address] Phone: [phone]   Fax:  [phone]  Name: BAYLA MCGOVERN MRN: [id number] Date of Birth: 1956/02/25

## 2018-06-13 ENCOUNTER — Ambulatory Visit (HOSPITAL_COMMUNITY): Payer: BLUE CROSS/BLUE SHIELD

## 2018-06-13 ENCOUNTER — Encounter (HOSPITAL_COMMUNITY): Payer: Self-pay

## 2018-06-13 DIAGNOSIS — R262 Difficulty in walking, not elsewhere classified: Secondary | ICD-10-CM

## 2018-06-13 DIAGNOSIS — M6281 Muscle weakness (generalized): Secondary | ICD-10-CM

## 2018-06-13 DIAGNOSIS — R2689 Other abnormalities of gait and mobility: Secondary | ICD-10-CM

## 2018-06-13 DIAGNOSIS — M25671 Stiffness of right ankle, not elsewhere classified: Secondary | ICD-10-CM | POA: Diagnosis not present

## 2018-06-13 NOTE — Therapy (Signed)
[facility] [place], [place], [address] Phone: [phone]   Fax:  [phone]  Physical Therapy Treatment  Patient Details  Name: Janice Gonzales MRN: [id number] Date of Birth: 07-Apr-1956 Referring Provider (PT): Arther Abbott, MD   Encounter Date: 06/13/2018  PT End of Session - 06/13/18 1033    Visit Number  15    Number of Visits  21    Date for PT Re-Evaluation  06/18/18    Authorization Type  BCBS Other    Authorization Time Period  04/24/18 to 05/22/18; NEW: 05/21/18 to 06/18/18    Authorization - Visit Number  15    Authorization - Number of Visits  60   medical eligibility review after 25th visit   PT Start Time  1030    PT Stop Time  1110    PT Time Calculation (min)  40 min    Activity Tolerance  Patient tolerated treatment well;No increased pain    Behavior During Therapy  WFL for tasks assessed/performed       Past Medical History:  Diagnosis Date  . Arthritis   . Glaucoma   . History of DVT of lower extremity    with PE  . Hyperlipidemia 07/05/2017  . Hypertension   . Post herpetic neuralgia 06/17/2017  . Sleep apnea     Past Surgical History:  Procedure Laterality Date  . ORIF ANKLE FRACTURE Right 01/06/2018   Procedure: OPEN REDUCTION INTERNAL FIXATION (ORIF) RIGHT ANKLE FRACTURE;  Surgeon: Carole Civil, MD;  Location: [facility];  Service: Orthopedics;  Laterality: Right;    There were no vitals filed for this visit.  Subjective Assessment - 06/13/18 1034    Subjective  Pt states her anke is tight this morning. She has been on her feet a lot already this morning at work.    Limitations  Standing;Walking;House hold activities    Patient Stated Goals  walk without a limp and walk normally again    Currently in Pain?  No/denies              [facility] Adult PT Treatment/Exercise - 06/13/18 0001      Manual Therapy   Manual Therapy  Joint mobilization;Soft tissue mobilization    Manual therapy comments  Manual completed separate from all other exercises    Joint Mobilization  AP, PA talocrural and medial/lateral subtalar joint mobs for improved DF, PF, inv, ev throughout    Soft tissue mobilization  STM to R gastroc/solues complex to decrease restrictions to improve ROM      Ankle Exercises: Standing   BAPS  Standing;Level 3;10 reps    BAPS Limitations  board inverted to increase DF; +inv/ev, CW/CCW           PT Education - 06/13/18 1033    Education Details  exercise technique    Person(s) Educated  Patient    Methods  Explanation;Demonstration    Comprehension  Verbalized understanding;Returned demonstration       PT Short Term Goals - 05/21/18 0951      PT SHORT TERM GOAL #1   Title  Pt will have improved R ankle AROM by 5 deg throughout in order to maximize gait.    Baseline  10/2: see AROM    Time  2    Period  Weeks    Status  Partially Met      PT SHORT TERM GOAL #2   Title  Pt will have 1/2 grade improvement  throughout ankle MMT in order to maximize gait and balance.    Baseline  10/2: see MMT    Time  2    Period  Weeks    Status  Achieved      PT SHORT TERM GOAL #3   Title  Pt will be able to perform R SLS for 10 sec or > to demo improved functional ankle strength and maximize gait.    Baseline  10/2: 5sec or < on R    Time  2    Period  Weeks    Status  On-going        PT Long Term Goals - 05/21/18 0951      PT LONG TERM GOAL #1   Title  Pt will have decreased edema of R ankle by 2cm in order to maximize AROM.    Baseline  10/2: decreaesd by 1.75cm figure 8    Time  4    Period  Weeks    Status  Partially Met      PT LONG TERM GOAL #2   Title  Pt will have improved R ankle DF to -5deg from neutral or better in order to further maximize gait and stair ambulation.    Baseline  10/2: -8deg knee bent, -5deg knee straight    Time  4    Period  Weeks    Status  Partially Met      PT LONG TERM GOAL #3   Title  Pt will have  at least 131f improvement in 3MWT with gait WFL in order to maximize her ability to RTW with minimal to no difficulties.    Baseline  10/2: improved to 6729f but R foot ER continued throughout    Time  4    Period  Weeks    Status  Partially Met      PT LONG TERM GOAL #4   Title  Pt will be able to perform R SLS for 20 sec or > to further demo improved functional ankle strength and maximize her stair ambulation.    Baseline  10/2: 5sec or < on R    Time  4    Period  Weeks    Status  On-going            Plan - 06/13/18 1111    Clinical Impression Statement  Began session with manual joint mobs and STM for improved ROM. Pt noted to be stiff throughout, mainly DF and during lateral > medial glides of subtalar joint. Also performed STM to gastroc/soleus complex to reduce restrictions to facilitate decreased tightness and improve ROM. Assessed bil foot positioning in barefoot standing; noted to have increased medial arch collapse > on the L compared to the R but both had increased calcaneal inversion. Ended session with mobility work on [employer]   Rehab Potential  Good    PT Frequency  3x / week    PT Duration  4 weeks    PT Treatment/Interventions  ADLs/Self Care Home Management;Biofeedback;Cryotherapy;Electrical Stimulation;Moist Heat;Traction;Ultrasound;Gait training;Stair training;Functional mobility training;Therapeutic activities;Therapeutic exercise;Balance training;Neuromuscular re-education;Patient/family education;Manual techniques;Orthotic Fit/Training;Scar mobilization;Compression bandaging;Passive range of motion;Dry needling;Taping    PT Next Visit Plan  continue needling if +response; increase focus on soleus soft tissue restrictions; continue soleus stretch on step with knee bent for Rt LE. continue to address limitations in AROM and overall strength as well as addressing joint mobility. Ensure correct performance of therband HEP.    PT Home Exercise Plan  eval: ABCs,  standing calf stretch on step; 04/30/18 - towel slides/sweeps, standing calf stretch; 9/16: 1/2 kneel stretch for DF; 9/23: 4-way ankle seated; 10/3: so.eus stretch at wall    Consulted and Agree with Plan of Care  Patient       Patient will benefit from skilled therapeutic intervention in order to improve the following deficits and impairments:  Abnormal gait, Decreased activity tolerance, Decreased balance, Decreased endurance, Decreased range of motion, Decreased strength, Difficulty walking, Hypomobility, Increased edema, Increased fascial restricitons, Increased muscle spasms, Impaired flexibility, Impaired sensation, Pain  Visit Diagnosis: Stiffness of right ankle, not elsewhere classified  Difficulty in walking, not elsewhere classified  Other abnormalities of gait and mobility  Muscle weakness (generalized)     Problem List Patient Active Problem List   Diagnosis Date Noted  . S/P ORIF (open reduction internal fixation) fracture right ankle 01/06/18 01/09/2018  . Closed trimalleolar fracture of right ankle   . Hyperlipidemia 07/05/2017  . Vitamin D deficiency 07/05/2017  . Essential hypertension 06/17/2017  . Post herpetic neuralgia 06/17/2017  . History of pulmonary embolism 06/17/2017       Geraldine Solar PT, DPT  [facility] [address] [place], [place], [address] Phone: [phone]   Fax:  [phone]  Name: Janice Gonzales MRN: [id number] Date of Birth: 01/06/1956

## 2018-06-17 ENCOUNTER — Encounter (HOSPITAL_COMMUNITY): Payer: Self-pay

## 2018-06-17 ENCOUNTER — Ambulatory Visit (HOSPITAL_COMMUNITY): Payer: BLUE CROSS/BLUE SHIELD

## 2018-06-17 DIAGNOSIS — R2689 Other abnormalities of gait and mobility: Secondary | ICD-10-CM

## 2018-06-17 DIAGNOSIS — M25671 Stiffness of right ankle, not elsewhere classified: Secondary | ICD-10-CM

## 2018-06-17 DIAGNOSIS — M6281 Muscle weakness (generalized): Secondary | ICD-10-CM | POA: Diagnosis not present

## 2018-06-17 DIAGNOSIS — R262 Difficulty in walking, not elsewhere classified: Secondary | ICD-10-CM

## 2018-06-17 NOTE — Therapy (Signed)
[facility] [place], [place], [address] Phone: [phone]   Fax:  [phone]  Physical Therapy Treatment  Patient Details  Name: Janice Gonzales MRN: [id number] Date of Birth: 04-24-1956 Referring Provider (PT): Arther Abbott, MD   Encounter Date: 06/17/2018  PT End of Session - 06/17/18 1347    Visit Number  16    Number of Visits  21    Date for PT Re-Evaluation  06/18/18    Authorization Type  BCBS Other    Authorization Time Period  04/24/18 to 05/22/18; NEW: 05/21/18 to 06/18/18    Authorization - Visit Number  16    Authorization - Number of Visits  60   medical eligibility review after 25th visit   PT Start Time  3532    PT Stop Time  1428    PT Time Calculation (min)  41 min    Activity Tolerance  Patient tolerated treatment well;No increased pain    Behavior During Therapy  WFL for tasks assessed/performed       Past Medical History:  Diagnosis Date  . Arthritis   . Glaucoma   . History of DVT of lower extremity    with PE  . Hyperlipidemia 07/05/2017  . Hypertension   . Post herpetic neuralgia 06/17/2017  . Sleep apnea     Past Surgical History:  Procedure Laterality Date  . ORIF ANKLE FRACTURE Right 01/06/2018   Procedure: OPEN REDUCTION INTERNAL FIXATION (ORIF) RIGHT ANKLE FRACTURE;  Surgeon: Carole Civil, MD;  Location: [facility];  Service: Orthopedics;  Laterality: Right;    There were no vitals filed for this visit.  Subjective Assessment - 06/17/18 1348    Subjective  Pt states that she's been feeling pretty good. She had a rough day Sunday but she thinks it was because she didn't wear her compression garment and wore flat shoes.     Limitations  Standing;Walking;House hold activities    Patient Stated Goals  walk without a limp and walk normally again    Currently in Pain?  No/denies          [facility] PT Assessment - 06/17/18 0001      AROM   Right Ankle Dorsiflexion  -4    after needling        [facility] Adult PT Treatment/Exercise - 06/17/18 0001      Manual Therapy   Manual Therapy  Soft tissue mobilization    Manual therapy comments  Manual completed separate from all other exercises    Soft tissue mobilization  STM and IASTM with tool to R gastroc soleus complex to increase blood flwo and reduce restrictions and improve ROM      Ankle Exercises: Stretches   Soleus Stretch  2 reps;30 seconds;Limitations    Soleus Stretch Limitations  RLE, runner's stretch knee bent    Gastroc Stretch  3 reps;30 seconds;Limitations    Gastroc Stretch Limitations  RLE only on step      Ankle Exercises: Standing   Heel Raises  Right;20 reps       Trigger Point Dry Needling - 06/17/18 1349    Consent Given?  Yes    Education Handout Provided  No    Muscles Treated Lower Body  Gastrocnemius;Soleus    Gastrocnemius Response  Twitch response elicited;Palpable increased muscle length   L in prone   Soleus Response  Twitch response elicited;Palpable increased muscle length   L in prone  PT Education - 06/17/18 1348    Education Details  will reassess next visit    Person(s) Educated  Patient    Methods  Explanation    Comprehension  Verbalized understanding       PT Short Term Goals - 05/21/18 0951      PT SHORT TERM GOAL #1   Title  Pt will have improved R ankle AROM by 5 deg throughout in order to maximize gait.    Baseline  10/2: see AROM    Time  2    Period  Weeks    Status  Partially Met      PT SHORT TERM GOAL #2   Title  Pt will have 1/2 grade improvement throughout ankle MMT in order to maximize gait and balance.    Baseline  10/2: see MMT    Time  2    Period  Weeks    Status  Achieved      PT SHORT TERM GOAL #3   Title  Pt will be able to perform R SLS for 10 sec or > to demo improved functional ankle strength and maximize gait.    Baseline  10/2: 5sec or < on R    Time  2    Period  Weeks    Status  On-going         PT Long Term Goals - 05/21/18 0951      PT LONG TERM GOAL #1   Title  Pt will have decreased edema of R ankle by 2cm in order to maximize AROM.    Baseline  10/2: decreaesd by 1.75cm figure 8    Time  4    Period  Weeks    Status  Partially Met      PT LONG TERM GOAL #2   Title  Pt will have improved R ankle DF to -5deg from neutral or better in order to further maximize gait and stair ambulation.    Baseline  10/2: -8deg knee bent, -5deg knee straight    Time  4    Period  Weeks    Status  Partially Met      PT LONG TERM GOAL #3   Title  Pt will have at least 170f improvement in 3MWT with gait WFL in order to maximize her ability to RTW with minimal to no difficulties.    Baseline  10/2: improved to 6727f but R foot ER continued throughout    Time  4    Period  Weeks    Status  Partially Met      PT LONG TERM GOAL #4   Title  Pt will be able to perform R SLS for 20 sec or > to further demo improved functional ankle strength and maximize her stair ambulation.    Baseline  10/2: 5sec or < on R    Time  4    Period  Weeks    Status  On-going            Plan - 06/17/18 1428    Clinical Impression Statement  Continued with trigger point dry needling to R gastroc/soleus complex to reduce tightness and restrictions in order to improve R ankle DF ROM. Good twitch responses elicited throughout. Followed up with STM and IASTM with tool to mm to promote blood flow to the area in order to promote tissue healing and further reduce tightness and mm restrictions. AROM after needling -4deg. Ended with stretching and mm activation. Reduced tightness  reported at [place]. Pt due for reassessment next session.    Rehab Potential  Good    PT Frequency  3x / week    PT Duration  4 weeks    PT Treatment/Interventions  ADLs/Self Care Home Management;Biofeedback;Cryotherapy;Electrical Stimulation;Moist Heat;Traction;Ultrasound;Gait training;Stair training;Functional mobility  training;Therapeutic activities;Therapeutic exercise;Balance training;Neuromuscular re-education;Patient/family education;Manual techniques;Orthotic Fit/Training;Scar mobilization;Compression bandaging;Passive range of motion;Dry needling;Taping    PT Next Visit Plan  reassessment; increase focus on soleus soft tissue restrictions; continue soleus stretch on step with knee bent for Rt LE. continue to address limitations in AROM and overall strength as well as addressing joint mobility. Ensure correct performance of therband HEP.    PT Home Exercise Plan  eval: ABCs, standing calf stretch on step; 04/30/18 - towel slides/sweeps, standing calf stretch; 9/16: 1/2 kneel stretch for DF; 9/23: 4-way ankle seated; 10/3: so.eus stretch at wall    Consulted and Agree with Plan of Care  Patient       Patient will benefit from skilled therapeutic intervention in order to improve the following deficits and impairments:  Abnormal gait, Decreased activity tolerance, Decreased balance, Decreased endurance, Decreased range of motion, Decreased strength, Difficulty walking, Hypomobility, Increased edema, Increased fascial restricitons, Increased muscle spasms, Impaired flexibility, Impaired sensation, Pain  Visit Diagnosis: Stiffness of right ankle, not elsewhere classified  Difficulty in walking, not elsewhere classified  Other abnormalities of gait and mobility  Muscle weakness (generalized)     Problem List Patient Active Problem List   Diagnosis Date Noted  . S/P ORIF (open reduction internal fixation) fracture right ankle 01/06/18 01/09/2018  . Closed trimalleolar fracture of right ankle   . Hyperlipidemia 07/05/2017  . Vitamin D deficiency 07/05/2017  . Essential hypertension 06/17/2017  . Post herpetic neuralgia 06/17/2017  . History of pulmonary embolism 06/17/2017       Geraldine Solar PT, DPT  [facility] [address] [place], [place],  [address] Phone: [phone]   Fax:  [phone]  Name: ZLATA ALCAIDE MRN: [id number] Date of Birth: Sep 24, 1955

## 2018-06-20 ENCOUNTER — Encounter (HOSPITAL_COMMUNITY): Payer: Self-pay

## 2018-06-20 ENCOUNTER — Ambulatory Visit (HOSPITAL_COMMUNITY): Payer: BLUE CROSS/BLUE SHIELD | Attending: Orthopedic Surgery

## 2018-06-20 DIAGNOSIS — M25671 Stiffness of right ankle, not elsewhere classified: Secondary | ICD-10-CM

## 2018-06-20 DIAGNOSIS — R262 Difficulty in walking, not elsewhere classified: Secondary | ICD-10-CM | POA: Diagnosis not present

## 2018-06-20 DIAGNOSIS — R2689 Other abnormalities of gait and mobility: Secondary | ICD-10-CM

## 2018-06-20 DIAGNOSIS — M6281 Muscle weakness (generalized): Secondary | ICD-10-CM | POA: Diagnosis not present

## 2018-06-20 NOTE — Therapy (Signed)
[facility] [address] [place], [place], [address] Phone: [phone]   Fax:  [phone]   Progress Note Reporting Period 05/21/18 to 06/20/18  See note below for Objective Data and Assessment of Progress/Goals.    Physical Therapy Treatment  Patient Details  Name: Janice Gonzales MRN: [id number] Date of Birth: 06/03/1956 Referring Provider (PT): Stanley Harrison, MD   Encounter Date: 06/20/2018  PT End of Session - 06/20/18 0950    Visit Number  17    Number of Visits  21    Date for PT Re-Evaluation  07/23/18    Authorization Type  BCBS Other    Authorization Time Period  04/24/18 to 05/22/18; NEW: 05/21/18 to 06/18/18; NEW 4-week HEP POC: 06/20/18 to 07/23/18    Authorization - Visit Number  17    Authorization - Number of Visits  60   medical eligibility review after 25th visit   PT Start Time  0949    PT Stop Time  1013    PT Time Calculation (min)  24 min    Activity Tolerance  Patient tolerated treatment well;No increased pain    Behavior During Therapy  WFL for tasks assessed/performed       Past Medical History:  Diagnosis Date  . Arthritis   . Glaucoma   . History of DVT of lower extremity    with PE  . Hyperlipidemia 07/05/2017  . Hypertension   . Post herpetic neuralgia 06/17/2017  . Sleep apnea     Past Surgical History:  Procedure Laterality Date  . ORIF ANKLE FRACTURE Right 01/06/2018   Procedure: OPEN REDUCTION INTERNAL FIXATION (ORIF) RIGHT ANKLE FRACTURE;  Surgeon: Harrison, Stanley E, MD;  Location: [facility];  Service: Orthopedics;  Laterality: Right;    There were no vitals filed for this visit.  Subjective Assessment - 06/20/18 0951    Subjective  Pt reports that she feels good today.    Limitations  Standing;Walking;House hold activities    Patient Stated Goals  walk without a limp and walk normally again    Currently in Pain?  No/denies         [facility] PT Assessment - 06/20/18 0001      Assessment   Medical Diagnosis  s/p ORIF R ankle    Referring Provider (PT)  Stanley Harrison, MD    Onset Date/Surgical Date  01/06/18    Next MD Visit  07/16/18    Prior Therapy  none for current issues      Observation/Other Assessments-Edema    Edema  Figure 8      Figure 8 Edema   Figure 8 - Right   54cm   was 54.25cm     AROM   Right Ankle Dorsiflexion  -5   was -8 knee bent and -5 knee bent at last reassessment     Ambulation/Gait   Ambulation Distance (Feet)  788 Feet   3MWT; was 678ft   Assistive device  None    Gait Pattern  Within Functional Limits      Balance   Balance Assessed  Yes      Static Standing Balance   Static Standing - Balance Support  No upper extremity supported    Static Standing Balance -  Activities   Single Leg Stance - Right Leg    Static Standing - Comment/# of Minutes  R: 5sec or <           PT Education - 06/20/18 0950      Education Details  reassessment findings    Person(s) Educated  Patient    Methods  Explanation    Comprehension  Verbalized understanding         PT Short Term Goals - 06/20/18 0952      PT SHORT TERM GOAL #1   Title  Pt will have improved R ankle AROM by 5 deg throughout in order to maximize gait.    Baseline  11/1: see AROM    Time  2    Period  Weeks    Status  Achieved      PT SHORT TERM GOAL #2   Title  Pt will have 1/2 grade improvement throughout ankle MMT in order to maximize gait and balance.    Baseline  10/2: see MMT    Time  2    Period  Weeks    Status  Achieved      PT SHORT TERM GOAL #3   Title  Pt will be able to perform R SLS for 10 sec or > to demo improved functional ankle strength and maximize gait.    Baseline  11/1: 5sec or < on R    Time  2    Period  Weeks    Status  On-going        PT Long Term Goals - 06/20/18 4562      PT LONG TERM GOAL #1   Title  Pt will have decreased edema of R ankle by 2cm in order to maximize AROM.    Baseline  11/1: decreaesd by 1.75cm  figure 8    Time  4    Period  Weeks    Status  Partially Met      PT LONG TERM GOAL #2   Title  Pt will have improved R ankle DF to -5deg from neutral or better in order to further maximize gait and stair ambulation.    Baseline  11/1: -5deg knee bent and straight    Time  4    Period  Weeks    Status  Achieved      PT LONG TERM GOAL #3   Title  Pt will have at least 151f improvement in 3MWT with gait WFL in order to maximize her ability to RTW with minimal to no difficulties.    Baseline  11/1: 7759f gait WFL    Time  4    Period  Weeks    Status  Achieved      PT LONG TERM GOAL #4   Title  Pt will be able to perform R SLS for 20 sec or > to further demo improved functional ankle strength and maximize her stair ambulation.    Baseline  11/1: 5sec or < on R    Time  4    Period  Weeks    Status  On-going            Plan - 06/20/18 1016    Clinical Impression Statement  PT reassessed pt's goals and outcome measures this date. Overall, pt has made great progress towards goals since her initial evaluation as illustrated above. Her 3MWT has normalized, her AROM improved to -5deg from neutral when it was -13, and her swelling has reduced by 1/4 of an inch. Her balance is her main limitation at this time as she could only perform SLS for 5sec or < on the RLE. She states that her R ankle swells after a work shift and she is limping on it  by the end of the day; PT explained that this is normal as it will take 41month - 1 year for her swelling after activity to decrease and for her mm endurance to improve and she verbalized understanding. PT recommends placing pt on 4-week HEP POC for her to maintain progress made at home and continue to improve her balance. She was agreeable to this plan.     Clinical Presentation  Stable    Clinical Decision Making  Low    Rehab Potential  Good    PT Frequency  Other (comment)   1 f/u visit in 1 month   PT Duration  4 weeks    PT  Treatment/Interventions  ADLs/Self Care Home Management;Biofeedback;Cryotherapy;Electrical Stimulation;Moist Heat;Traction;Ultrasound;Gait training;Stair training;Functional mobility training;Therapeutic activities;Therapeutic exercise;Balance training;Neuromuscular re-education;Patient/family education;Manual techniques;Orthotic Fit/Training;Scar mobilization;Compression bandaging;Passive range of motion;Dry needling;Taping    PT Next Visit Plan  4-week HEP POC    PT Home Exercise Plan  eval: ABCs, standing calf stretch on step; 04/30/18 - towel slides/sweeps, standing calf stretch; 9/16: 1/2 kneel stretch for DF; 9/23: 4-way ankle seated; 10/3: so.eus stretch at wall; 11/1: seated heel slides, SLS, SLS vectors, tandem stance EO/EC    Consulted and Agree with Plan of Care  Patient       Patient will benefit from skilled therapeutic intervention in order to improve the following deficits and impairments:  Abnormal gait, Decreased activity tolerance, Decreased balance, Decreased endurance, Decreased range of motion, Decreased strength, Difficulty walking, Hypomobility, Increased edema, Increased fascial restricitons, Increased muscle spasms, Impaired flexibility, Impaired sensation, Pain  Visit Diagnosis: Stiffness of right ankle, not elsewhere classified - Plan: PT plan of care cert/re-cert  Difficulty in walking, not elsewhere classified - Plan: PT plan of care cert/re-cert  Other abnormalities of gait and mobility - Plan: PT plan of care cert/re-cert  Muscle weakness (generalized) - Plan: PT plan of care cert/re-cert     Problem List Patient Active Problem List   Diagnosis Date Noted  . S/P ORIF (open reduction internal fixation) fracture right ankle 01/06/18 01/09/2018  . Closed trimalleolar fracture of right ankle   . Hyperlipidemia 07/05/2017  . Vitamin D deficiency 07/05/2017  . Essential hypertension 06/17/2017  . Post herpetic neuralgia 06/17/2017  . History of pulmonary embolism  06/17/2017        BGeraldine SolarPT, DPT  [place] AvenueSOronoque [place] [address]Phone: [phone]  Fax:  [phone] Name: Janice ASLINMRN: [id number] of Birth: 1Jul 01, 1957

## 2018-07-02 DIAGNOSIS — L570 Actinic keratosis: Secondary | ICD-10-CM | POA: Diagnosis not present

## 2018-07-02 DIAGNOSIS — D1801 Hemangioma of skin and subcutaneous tissue: Secondary | ICD-10-CM | POA: Diagnosis not present

## 2018-07-02 DIAGNOSIS — L819 Disorder of pigmentation, unspecified: Secondary | ICD-10-CM | POA: Diagnosis not present

## 2018-07-04 DIAGNOSIS — I10 Essential (primary) hypertension: Secondary | ICD-10-CM | POA: Diagnosis not present

## 2018-07-16 ENCOUNTER — Encounter: Payer: Self-pay | Admitting: Orthopedic Surgery

## 2018-07-16 ENCOUNTER — Ambulatory Visit: Payer: BLUE CROSS/BLUE SHIELD | Admitting: Orthopedic Surgery

## 2018-07-16 VITALS — BP 130/90 | HR 71 | Ht 65.0 in | Wt 187.0 lb

## 2018-07-16 DIAGNOSIS — Z8781 Personal history of (healed) traumatic fracture: Secondary | ICD-10-CM

## 2018-07-16 DIAGNOSIS — Z9889 Other specified postprocedural states: Secondary | ICD-10-CM

## 2018-07-16 NOTE — Progress Notes (Signed)
Chief Complaint  Patient presents with  . Follow-up      Recheck on right ankle fracture, DOS 01-06-18.    6 months after bimalleolar ankle fracture patient doing well complains of some chronic postsurgical numbness on the top of the foot between the middle digits.  Ankle motion is improved she is in physical therapy having trouble with her proprioception has 1 more appointment which she should keep  Her incisions healed well her ankle motion is excellent the decreased sensation on the top of the foot has been present since pre-and postop  Recommend six-month follow-up x-ray  Encounter Diagnosis  Name Primary?  . S/P ORIF (open reduction internal fixation) fracture right ankle 01/06/18 Yes

## 2018-07-23 ENCOUNTER — Ambulatory Visit (HOSPITAL_COMMUNITY): Payer: BLUE CROSS/BLUE SHIELD | Attending: Orthopedic Surgery

## 2018-07-23 ENCOUNTER — Encounter (HOSPITAL_COMMUNITY): Payer: Self-pay

## 2018-07-23 DIAGNOSIS — R2689 Other abnormalities of gait and mobility: Secondary | ICD-10-CM

## 2018-07-23 DIAGNOSIS — M6281 Muscle weakness (generalized): Secondary | ICD-10-CM

## 2018-07-23 DIAGNOSIS — M25671 Stiffness of right ankle, not elsewhere classified: Secondary | ICD-10-CM | POA: Diagnosis not present

## 2018-07-23 DIAGNOSIS — R262 Difficulty in walking, not elsewhere classified: Secondary | ICD-10-CM

## 2018-07-23 NOTE — Therapy (Signed)
[facility] [address] [place], [place], [address] Phone: [phone]   Fax:  [phone]   PHYSICAL THERAPY DISCHARGE SUMMARY  Visits from Start of Care: 18  Current functional level related to goals / functional outcomes: See below   Remaining deficits: See below   Education / Equipment: Continue HEP  Plan: Patient agrees to discharge.  Patient goals were partially met. Patient is being discharged due to meeting the stated rehab goals.  ?????    Physical Therapy Treatment  Patient Details  Name: Janice Gonzales MRN: [id number] Date of Birth: March 17, 1956 Referring Provider (PT): Arther Abbott, MD   Encounter Date: 07/23/2018  PT End of Session - 07/23/18 0945    Visit Number  18    Number of Visits  21    Date for PT Re-Evaluation  07/23/18    Authorization Type  BCBS Other    Authorization Time Period  04/24/18 to 05/22/18; NEW: 05/21/18 to 06/18/18; NEW 4-week HEP POC: 06/20/18 to 07/23/18    Authorization - Visit Number  45    Authorization - Number of Visits  60   medical eligibility review after 25th visit   PT Start Time  0945    PT Stop Time  1003    PT Time Calculation (min)  18 min    Activity Tolerance  Patient tolerated treatment well;No increased pain    Behavior During Therapy  WFL for tasks assessed/performed       Past Medical History:  Diagnosis Date  . Arthritis   . Glaucoma   . History of DVT of lower extremity    with PE  . Hyperlipidemia 07/05/2017  . Hypertension   . Post herpetic neuralgia 06/17/2017  . Sleep apnea     Past Surgical History:  Procedure Laterality Date  . ORIF ANKLE FRACTURE Right 01/06/2018   Procedure: OPEN REDUCTION INTERNAL FIXATION (ORIF) RIGHT ANKLE FRACTURE;  Surgeon: Carole Civil, MD;  Location: [facility];  Service: Orthopedics;  Laterality: Right;    There were no vitals filed for this visit.  Subjective Assessment - 07/23/18 0945    Subjective  Pt states  that she's feeling pretty good. Her ankle is still stiff as can be in the mornings and then still swells in the evenings. If she works a full day her ankle can sometimes be painful by the end of the day.     Limitations  Standing;Walking;House hold activities    Patient Stated Goals  walk without a limp and walk normally again    Currently in Pain?  No/denies           [facility] PT Assessment - 07/23/18 0001      Assessment   Medical Diagnosis  s/p ORIF R ankle    Referring Provider (PT)  Arther Abbott, MD    Onset Date/Surgical Date  01/06/18    Next MD Visit  May 2020    Prior Therapy  none for current issues      Figure 8 Edema   Figure 8 - Right   54cm   was 54.25cm     AROM   Right Ankle Dorsiflexion  -2   was -5   Right Ankle Plantar Flexion  60   was 61   Right Ankle Inversion  25   was 16   Right Ankle Eversion  14   was 10     Balance   Balance Assessed  Yes  Static Standing Balance   Static Standing - Balance Support  No upper extremity supported    Static Standing Balance -  Activities   Single Leg Stance - Right Leg    Static Standing - Comment/# of Minutes  R: 6sec or <           PT Education - 07/23/18 0945    Education Details  discharge plans, continue HEP    Person(s) Educated  Patient    Methods  Explanation    Comprehension  Verbalized understanding       PT Short Term Goals - 07/23/18 1006      PT SHORT TERM GOAL #1   Title  Pt will have improved R ankle AROM by 5 deg throughout in order to maximize gait.    Baseline  11/1: see AROM    Time  2    Period  Weeks    Status  Achieved      PT SHORT TERM GOAL #2   Title  Pt will have 1/2 grade improvement throughout ankle MMT in order to maximize gait and balance.    Baseline  10/2: see MMT    Time  2    Period  Weeks    Status  Achieved      PT SHORT TERM GOAL #3   Title  Pt will be able to perform R SLS for 10 sec or > to demo improved functional ankle strength and maximize  gait.    Baseline  12/4: 6sec or < on R    Time  2    Period  Weeks    Status  On-going        PT Long Term Goals - 07/23/18 1007      PT LONG TERM GOAL #1   Title  Pt will have decreased edema of R ankle by 2cm in order to maximize AROM.    Baseline  12/4: decreaesd by 1.75cm figure 8    Time  4    Period  Weeks    Status  Partially Met      PT LONG TERM GOAL #2   Title  Pt will have improved R ankle DF to -5deg from neutral or better in order to further maximize gait and stair ambulation.    Baseline  12/4: -2deg knee bent    Time  4    Period  Weeks    Status  Achieved      PT LONG TERM GOAL #3   Title  Pt will have at least 139f improvement in 3MWT with gait WFL in order to maximize her ability to RTW with minimal to no difficulties.    Baseline  11/1: 7772f gait WFL    Time  4    Period  Weeks    Status  Achieved      PT LONG TERM GOAL #4   Title  Pt will be able to perform R SLS for 20 sec or > to further demo improved functional ankle strength and maximize her stair ambulation.    Baseline  12/4: 6secor < on R    Time  4    Period  Weeks    Status  On-going            Plan - 07/23/18 1006    Clinical Impression Statement  Pt returns to therapy after 4-week HEP POC. Pt reports compliance with HEP. Her AROM has actually improved throughout all ROM except PF which remained unchanged. Her  balance is still limited but she is able to work on this independently at home. Pt will be d/c to her HEP at this time and she verbalized understanding.     Rehab Potential  Good    PT Frequency  Other (comment)   1 f/u visit in 1 month   PT Duration  4 weeks    PT Treatment/Interventions  ADLs/Self Care Home Management;Biofeedback;Cryotherapy;Electrical Stimulation;Moist Heat;Traction;Ultrasound;Gait training;Stair training;Functional mobility training;Therapeutic activities;Therapeutic exercise;Balance training;Neuromuscular re-education;Patient/family education;Manual  techniques;Orthotic Fit/Training;Scar mobilization;Compression bandaging;Passive range of motion;Dry needling;Taping    PT Next Visit Plan  discharged    PT Home Exercise Plan  eval: ABCs, standing calf stretch on step; 04/30/18 - towel slides/sweeps, standing calf stretch; 9/16: 1/2 kneel stretch for DF; 9/23: 4-way ankle seated; 10/3: so.eus stretch at wall; 11/1: seated heel slides, SLS, SLS vectors, tandem stance EO/EC    Consulted and Agree with Plan of Care  Patient       Patient will benefit from skilled therapeutic intervention in order to improve the following deficits and impairments:  Abnormal gait, Decreased activity tolerance, Decreased balance, Decreased endurance, Decreased range of motion, Decreased strength, Difficulty walking, Hypomobility, Increased edema, Increased fascial restricitons, Increased muscle spasms, Impaired flexibility, Impaired sensation, Pain  Visit Diagnosis: Stiffness of right ankle, not elsewhere classified  Difficulty in walking, not elsewhere classified  Other abnormalities of gait and mobility  Muscle weakness (generalized)     Problem List Patient Active Problem List   Diagnosis Date Noted  . S/P ORIF (open reduction internal fixation) fracture right ankle 01/06/18 01/09/2018  . Closed trimalleolar fracture of right ankle   . Hyperlipidemia 07/05/2017  . Vitamin D deficiency 07/05/2017  . Essential hypertension 06/17/2017  . Post herpetic neuralgia 06/17/2017  . History of pulmonary embolism 06/17/2017       Geraldine Solar PT, DPT  [facility] [address] [place], [place], [address] Phone: [phone]   Fax:  [phone]  Name: Janice Gonzales MRN: [id number] Date of Birth: 22-May-1956

## 2018-07-28 DIAGNOSIS — H401131 Primary open-angle glaucoma, bilateral, mild stage: Secondary | ICD-10-CM | POA: Diagnosis not present

## 2018-08-26 ENCOUNTER — Other Ambulatory Visit (HOSPITAL_COMMUNITY): Payer: Self-pay | Admitting: Internal Medicine

## 2018-08-26 DIAGNOSIS — Z0001 Encounter for general adult medical examination with abnormal findings: Secondary | ICD-10-CM | POA: Diagnosis not present

## 2018-08-26 DIAGNOSIS — I10 Essential (primary) hypertension: Secondary | ICD-10-CM | POA: Diagnosis not present

## 2018-08-26 DIAGNOSIS — Z23 Encounter for immunization: Secondary | ICD-10-CM | POA: Diagnosis not present

## 2018-08-26 DIAGNOSIS — Z0189 Encounter for other specified special examinations: Secondary | ICD-10-CM | POA: Diagnosis not present

## 2018-08-26 DIAGNOSIS — Z780 Asymptomatic menopausal state: Secondary | ICD-10-CM

## 2018-09-11 DIAGNOSIS — J09X2 Influenza due to identified novel influenza A virus with other respiratory manifestations: Secondary | ICD-10-CM | POA: Diagnosis not present

## 2018-09-29 DIAGNOSIS — Z1212 Encounter for screening for malignant neoplasm of rectum: Secondary | ICD-10-CM | POA: Diagnosis not present

## 2018-09-29 DIAGNOSIS — Z1211 Encounter for screening for malignant neoplasm of colon: Secondary | ICD-10-CM | POA: Diagnosis not present

## 2019-01-02 ENCOUNTER — Ambulatory Visit: Payer: BLUE CROSS/BLUE SHIELD | Admitting: Orthopedic Surgery

## 2019-01-02 ENCOUNTER — Ambulatory Visit (INDEPENDENT_AMBULATORY_CARE_PROVIDER_SITE_OTHER): Payer: BLUE CROSS/BLUE SHIELD

## 2019-01-02 ENCOUNTER — Telehealth: Payer: Self-pay | Admitting: Orthopedic Surgery

## 2019-01-02 ENCOUNTER — Encounter: Payer: Self-pay | Admitting: Orthopedic Surgery

## 2019-01-02 ENCOUNTER — Other Ambulatory Visit: Payer: Self-pay

## 2019-01-02 VITALS — Temp 97.3°F

## 2019-01-02 DIAGNOSIS — Z8781 Personal history of (healed) traumatic fracture: Secondary | ICD-10-CM

## 2019-01-02 DIAGNOSIS — Z9889 Other specified postprocedural states: Secondary | ICD-10-CM | POA: Diagnosis not present

## 2019-01-02 NOTE — Progress Notes (Signed)
Chief Complaint  Patient presents with  . Follow-up    Recheck on right ankle, DOS 01-06-18.    63 yo female 1 year postop right ankle fracture ORIF  Complains of numbness on the top of the foot Stiffness after sitting Swelling at the end of the day  Review of Systems  Musculoskeletal: Negative for falls.  Neurological: Positive for tingling and sensory change.   Physical Exam Vitals signs and nursing note reviewed.  Constitutional:      Appearance: Normal appearance.  Neurological:     Mental Status: She is alert and oriented to person, place, and time.  Psychiatric:        Mood and Affect: Mood normal.    Left ankle no edema full range of motion stable normal strength in all planes skin incisions normal pulse and temperature good color good decreased sensation on the dorsum of the foot involving the lesser digits Negative Tinel's over the superficial peroneal nerve at the fracture site fixation site and skin incision  X-ray today shows stable fixation no complications no arthritis  Encounter Diagnosis  Name Primary?  . S/P ORIF (open reduction internal fixation) fracture right ankle 01/06/18 Yes    Recommend desensitization with toothbrush Continue compression hose  She has a very good result she does have some residual numbness which is been persistent since the fracture on the dorsum of the foot with no evidence of neuroma  Follow-up as needed

## 2019-01-02 NOTE — Telephone Encounter (Signed)
Patient came back into office following appointment to request orthotics, said she forgot to ask while here for appointment. York Spaniel had some years ago but thought new ones may help ankle and foot. Please advise.

## 2019-01-06 NOTE — Telephone Encounter (Signed)
I called her back, I can fax it or mail it for her.

## 2019-01-14 ENCOUNTER — Ambulatory Visit: Payer: Self-pay | Admitting: Orthopedic Surgery

## 2019-01-28 DIAGNOSIS — M79672 Pain in left foot: Secondary | ICD-10-CM | POA: Diagnosis not present

## 2019-01-28 DIAGNOSIS — M7731 Calcaneal spur, right foot: Secondary | ICD-10-CM | POA: Diagnosis not present

## 2019-01-28 DIAGNOSIS — M722 Plantar fascial fibromatosis: Secondary | ICD-10-CM | POA: Diagnosis not present

## 2019-01-28 DIAGNOSIS — M79671 Pain in right foot: Secondary | ICD-10-CM | POA: Diagnosis not present

## 2019-01-28 DIAGNOSIS — M7732 Calcaneal spur, left foot: Secondary | ICD-10-CM | POA: Diagnosis not present

## 2019-02-12 DIAGNOSIS — M79671 Pain in right foot: Secondary | ICD-10-CM | POA: Diagnosis not present

## 2019-02-12 DIAGNOSIS — M722 Plantar fascial fibromatosis: Secondary | ICD-10-CM | POA: Diagnosis not present

## 2019-02-12 DIAGNOSIS — M7732 Calcaneal spur, left foot: Secondary | ICD-10-CM | POA: Diagnosis not present

## 2019-02-12 DIAGNOSIS — M79672 Pain in left foot: Secondary | ICD-10-CM | POA: Diagnosis not present

## 2019-03-05 DIAGNOSIS — M79672 Pain in left foot: Secondary | ICD-10-CM | POA: Diagnosis not present

## 2019-03-05 DIAGNOSIS — M722 Plantar fascial fibromatosis: Secondary | ICD-10-CM | POA: Diagnosis not present

## 2019-03-05 DIAGNOSIS — M7732 Calcaneal spur, left foot: Secondary | ICD-10-CM | POA: Diagnosis not present

## 2019-03-10 DIAGNOSIS — M722 Plantar fascial fibromatosis: Secondary | ICD-10-CM | POA: Diagnosis not present

## 2019-03-10 DIAGNOSIS — M79671 Pain in right foot: Secondary | ICD-10-CM | POA: Diagnosis not present

## 2019-03-10 DIAGNOSIS — M7732 Calcaneal spur, left foot: Secondary | ICD-10-CM | POA: Diagnosis not present

## 2019-03-10 DIAGNOSIS — M79672 Pain in left foot: Secondary | ICD-10-CM | POA: Diagnosis not present

## 2019-03-18 DIAGNOSIS — R7303 Prediabetes: Secondary | ICD-10-CM | POA: Diagnosis not present

## 2019-03-18 DIAGNOSIS — E669 Obesity, unspecified: Secondary | ICD-10-CM | POA: Diagnosis not present

## 2019-03-18 DIAGNOSIS — I10 Essential (primary) hypertension: Secondary | ICD-10-CM | POA: Diagnosis not present

## 2019-03-18 DIAGNOSIS — M7732 Calcaneal spur, left foot: Secondary | ICD-10-CM | POA: Diagnosis not present

## 2019-03-24 ENCOUNTER — Other Ambulatory Visit (HOSPITAL_COMMUNITY): Payer: Self-pay | Admitting: Internal Medicine

## 2019-03-24 DIAGNOSIS — E2839 Other primary ovarian failure: Secondary | ICD-10-CM

## 2019-03-24 DIAGNOSIS — Z780 Asymptomatic menopausal state: Secondary | ICD-10-CM

## 2019-03-24 DIAGNOSIS — Z1231 Encounter for screening mammogram for malignant neoplasm of breast: Secondary | ICD-10-CM

## 2019-04-01 ENCOUNTER — Ambulatory Visit (HOSPITAL_COMMUNITY)
Admission: RE | Admit: 2019-04-01 | Discharge: 2019-04-01 | Disposition: A | Discharge status: Home / Self Care | Payer: BC Managed Care – PPO | Source: Ambulatory Visit | Attending: Internal Medicine | Admitting: Internal Medicine

## 2019-04-01 ENCOUNTER — Other Ambulatory Visit: Payer: Self-pay

## 2019-04-01 DIAGNOSIS — Z13820 Encounter for screening for osteoporosis: Secondary | ICD-10-CM | POA: Diagnosis not present

## 2019-04-01 DIAGNOSIS — E2839 Other primary ovarian failure: Secondary | ICD-10-CM

## 2019-04-01 DIAGNOSIS — Z1231 Encounter for screening mammogram for malignant neoplasm of breast: Secondary | ICD-10-CM | POA: Insufficient documentation

## 2019-04-01 DIAGNOSIS — Z780 Asymptomatic menopausal state: Secondary | ICD-10-CM | POA: Insufficient documentation

## 2019-04-02 ENCOUNTER — Ambulatory Visit (HOSPITAL_COMMUNITY): Payer: BLUE CROSS/BLUE SHIELD

## 2019-04-02 ENCOUNTER — Other Ambulatory Visit (HOSPITAL_COMMUNITY): Payer: BLUE CROSS/BLUE SHIELD

## 2019-06-23 DIAGNOSIS — M722 Plantar fascial fibromatosis: Secondary | ICD-10-CM | POA: Diagnosis not present

## 2019-06-23 DIAGNOSIS — M79672 Pain in left foot: Secondary | ICD-10-CM | POA: Diagnosis not present

## 2019-06-23 DIAGNOSIS — M25579 Pain in unspecified ankle and joints of unspecified foot: Secondary | ICD-10-CM | POA: Diagnosis not present

## 2019-07-06 DIAGNOSIS — L819 Disorder of pigmentation, unspecified: Secondary | ICD-10-CM | POA: Diagnosis not present

## 2019-07-06 DIAGNOSIS — D239 Other benign neoplasm of skin, unspecified: Secondary | ICD-10-CM | POA: Diagnosis not present

## 2019-07-06 DIAGNOSIS — L309 Dermatitis, unspecified: Secondary | ICD-10-CM | POA: Diagnosis not present

## 2019-07-06 DIAGNOSIS — L821 Other seborrheic keratosis: Secondary | ICD-10-CM | POA: Diagnosis not present

## 2019-07-06 DIAGNOSIS — L570 Actinic keratosis: Secondary | ICD-10-CM | POA: Diagnosis not present

## 2019-11-10 DIAGNOSIS — G5750 Tarsal tunnel syndrome, unspecified lower limb: Secondary | ICD-10-CM | POA: Diagnosis not present

## 2019-11-10 DIAGNOSIS — M1990 Unspecified osteoarthritis, unspecified site: Secondary | ICD-10-CM | POA: Diagnosis not present

## 2019-11-10 DIAGNOSIS — M79672 Pain in left foot: Secondary | ICD-10-CM | POA: Diagnosis not present

## 2019-12-08 DIAGNOSIS — G5750 Tarsal tunnel syndrome, unspecified lower limb: Secondary | ICD-10-CM | POA: Diagnosis not present

## 2019-12-08 DIAGNOSIS — M79672 Pain in left foot: Secondary | ICD-10-CM | POA: Diagnosis not present

## 2019-12-08 DIAGNOSIS — M722 Plantar fascial fibromatosis: Secondary | ICD-10-CM | POA: Diagnosis not present

## 2020-02-03 ENCOUNTER — Telehealth: Payer: Self-pay | Admitting: Orthopedic Surgery

## 2020-02-03 ENCOUNTER — Other Ambulatory Visit (HOSPITAL_COMMUNITY): Payer: Self-pay | Admitting: Obstetrics & Gynecology

## 2020-02-03 DIAGNOSIS — Z1231 Encounter for screening mammogram for malignant neoplasm of breast: Secondary | ICD-10-CM

## 2020-02-03 NOTE — Telephone Encounter (Signed)
Patient came to office, states she had spoken with someone at our office earlier today about obtaining her medical records regarding her ankle on which she had surgery by Dr Romeo Apple, for 2nd opinion. I discussed protocol with patient regarding Ciox - said she hadn't gotten to discuss that part of it.  I provided last office notes, imaging reports, operative notes. States she was aware to pick up films at radiology department at [facility].

## 2020-02-04 DIAGNOSIS — Z1151 Encounter for screening for human papillomavirus (HPV): Secondary | ICD-10-CM | POA: Diagnosis not present

## 2020-02-04 DIAGNOSIS — Z01419 Encounter for gynecological examination (general) (routine) without abnormal findings: Secondary | ICD-10-CM | POA: Diagnosis not present

## 2020-02-08 DIAGNOSIS — S82851S Displaced trimalleolar fracture of right lower leg, sequela: Secondary | ICD-10-CM | POA: Diagnosis not present

## 2020-02-08 DIAGNOSIS — M79671 Pain in right foot: Secondary | ICD-10-CM | POA: Diagnosis not present

## 2020-02-08 DIAGNOSIS — M25571 Pain in right ankle and joints of right foot: Secondary | ICD-10-CM | POA: Diagnosis not present

## 2020-02-08 DIAGNOSIS — M19171 Post-traumatic osteoarthritis, right ankle and foot: Secondary | ICD-10-CM | POA: Diagnosis not present

## 2020-02-18 DIAGNOSIS — H40059 Ocular hypertension, unspecified eye: Secondary | ICD-10-CM | POA: Diagnosis not present

## 2020-02-18 DIAGNOSIS — E559 Vitamin D deficiency, unspecified: Secondary | ICD-10-CM | POA: Diagnosis not present

## 2020-02-18 DIAGNOSIS — R7303 Prediabetes: Secondary | ICD-10-CM | POA: Diagnosis not present

## 2020-02-18 DIAGNOSIS — I10 Essential (primary) hypertension: Secondary | ICD-10-CM | POA: Diagnosis not present

## 2020-02-18 DIAGNOSIS — E669 Obesity, unspecified: Secondary | ICD-10-CM | POA: Diagnosis not present

## 2020-02-18 DIAGNOSIS — H401190 Primary open-angle glaucoma, unspecified eye, stage unspecified: Secondary | ICD-10-CM | POA: Diagnosis not present

## 2020-02-24 DIAGNOSIS — R7303 Prediabetes: Secondary | ICD-10-CM | POA: Diagnosis not present

## 2020-02-24 DIAGNOSIS — M7732 Calcaneal spur, left foot: Secondary | ICD-10-CM | POA: Diagnosis not present

## 2020-02-24 DIAGNOSIS — E559 Vitamin D deficiency, unspecified: Secondary | ICD-10-CM | POA: Diagnosis not present

## 2020-02-24 DIAGNOSIS — I10 Essential (primary) hypertension: Secondary | ICD-10-CM | POA: Diagnosis not present

## 2020-03-22 ENCOUNTER — Other Ambulatory Visit (HOSPITAL_BASED_OUTPATIENT_CLINIC_OR_DEPARTMENT_OTHER): Payer: Self-pay

## 2020-03-22 DIAGNOSIS — G4710 Hypersomnia, unspecified: Secondary | ICD-10-CM

## 2020-03-22 DIAGNOSIS — R0683 Snoring: Secondary | ICD-10-CM

## 2020-03-31 ENCOUNTER — Ambulatory Visit: Payer: BC Managed Care – PPO | Attending: Internal Medicine | Admitting: Neurology

## 2020-03-31 ENCOUNTER — Other Ambulatory Visit: Payer: Self-pay

## 2020-03-31 DIAGNOSIS — Z79899 Other long term (current) drug therapy: Secondary | ICD-10-CM | POA: Diagnosis not present

## 2020-03-31 DIAGNOSIS — G4710 Hypersomnia, unspecified: Secondary | ICD-10-CM

## 2020-03-31 DIAGNOSIS — G4733 Obstructive sleep apnea (adult) (pediatric): Secondary | ICD-10-CM | POA: Diagnosis not present

## 2020-03-31 DIAGNOSIS — R0683 Snoring: Secondary | ICD-10-CM | POA: Insufficient documentation

## 2020-03-31 DIAGNOSIS — Z7982 Long term (current) use of aspirin: Secondary | ICD-10-CM | POA: Diagnosis not present

## 2020-04-02 NOTE — Procedures (Signed)
   [facility] Dolce Sylvia A. Gerilyn Pilgrim, MD     [email or web address]             NOCTURNAL POLYSOMNOGRAPHY   LOCATION: [facility]  Patient Name: Janice Gonzales, Janice Gonzales Date: 03/31/2020 Gender: Female D.O.B: Jun 02, 1956 Age (years): 64 Referring Provider: Catalina Pizza Height (inches): 65 Interpreting Physician: Beryle Beams MD, ABSM Weight (lbs): 193 RPSGT: Peak, Robert BMI: 32 MRN: [id number] Neck Size: 15.00 CLINICAL INFORMATION Sleep Study Type: NPSG     Indication for sleep study: N/A     Epworth Sleepiness Score: 9     SLEEP STUDY TECHNIQUE As per the AASM Manual for the Scoring of Sleep and Associated Events v2.3 (April 2016) with a hypopnea requiring 4% desaturations.  The channels recorded and monitored were frontal, central and occipital EEG, electrooculogram (EOG), submentalis EMG (chin), nasal and oral airflow, thoracic and abdominal wall motion, anterior tibialis EMG, snore microphone, electrocardiogram, and pulse oximetry.  MEDICATIONS Medications self-administered by patient taken the night of the study : N/A  Current Outpatient Medications:  .  aspirin EC 81 MG tablet, Take 81 mg by mouth daily., Disp: , Rfl:  .  Cholecalciferol (VITAMIN D3 PO), Take 1 capsule by mouth daily., Disp: , Rfl:  .  KRILL OIL PO, Take 1 capsule by mouth daily., Disp: , Rfl:  .  losartan (COZAAR) 100 MG tablet, Take 100 mg by mouth daily., Disp: , Rfl: 1 .  timolol (BETIMOL) 0.5 % ophthalmic solution, Place 1 drop into both eyes daily., Disp: , Rfl:      SLEEP ARCHITECTURE The study was initiated at 10:21:27 PM and ended at 5:29:31 AM.  Sleep onset time was 8.7 minutes and the sleep efficiency was 47.6%%. The total sleep time was 203.9 minutes.  Stage REM latency was 302.0 minutes.  The patient spent 13.5%% of the night in stage N1 sleep, 60.0%% in stage N2 sleep, 4.9%% in stage N3 and 21.6% in REM.  Alpha intrusion was absent.  Supine sleep was  7.36%.  RESPIRATORY PARAMETERS The overall apnea/hypopnea index (AHI) was 15 per hour. There were 6 total apneas, including 6 obstructive, 0 central and 0 mixed apneas. There were 44 hypopneas and 3 RERAs.  The AHI during Stage REM sleep was 0.0 per hour.  AHI while supine was 88.0 per hour.  The mean oxygen saturation was 91.1%. The minimum SpO2 during sleep was 86.0%.  moderate snoring was noted during this study.  CARDIAC DATA The 2 lead EKG demonstrated sinus rhythm. The mean heart rate was 65.8 beats per minute. Other EKG findings include: None.   LEG MOVEMENT DATA The total PLMS were 0 with a resulting PLMS index of 0.0. Associated arousal with leg movement index was 0.0.  IMPRESSIONS 1. Moderate obstructive sleep apnea syndrome is documented with this study. A trial of auto PAP of the 5-12 is recommended.  2. Significantly reduced sleep efficiency is noted.   Argie Ramming, MD Diplomate, American Board of Sleep Medicine.   ELECTRONICALLY SIGNED ON:  04/02/2020, 5:26 PM [facility] PH: [phone]   FX: [phone] ACCREDITED BY THE AMERICAN ACADEMY OF SLEEP MEDICINE

## 2020-04-04 ENCOUNTER — Ambulatory Visit (HOSPITAL_COMMUNITY)
Admission: RE | Admit: 2020-04-04 | Discharge: 2020-04-04 | Disposition: A | Discharge status: Home / Self Care | Payer: BC Managed Care – PPO | Source: Ambulatory Visit | Attending: Obstetrics & Gynecology | Admitting: Obstetrics & Gynecology

## 2020-04-04 ENCOUNTER — Other Ambulatory Visit: Payer: Self-pay

## 2020-04-04 DIAGNOSIS — Z1231 Encounter for screening mammogram for malignant neoplasm of breast: Secondary | ICD-10-CM | POA: Insufficient documentation

## 2020-04-19 DIAGNOSIS — M79672 Pain in left foot: Secondary | ICD-10-CM | POA: Diagnosis not present

## 2020-04-19 DIAGNOSIS — M1990 Unspecified osteoarthritis, unspecified site: Secondary | ICD-10-CM | POA: Diagnosis not present

## 2020-04-19 DIAGNOSIS — M722 Plantar fascial fibromatosis: Secondary | ICD-10-CM | POA: Diagnosis not present

## 2020-05-02 DIAGNOSIS — M659 Synovitis and tenosynovitis, unspecified: Secondary | ICD-10-CM | POA: Diagnosis not present

## 2020-05-02 DIAGNOSIS — M25571 Pain in right ankle and joints of right foot: Secondary | ICD-10-CM | POA: Diagnosis not present

## 2020-05-02 DIAGNOSIS — M79671 Pain in right foot: Secondary | ICD-10-CM | POA: Diagnosis not present

## 2020-05-02 DIAGNOSIS — T8484XA Pain due to internal orthopedic prosthetic devices, implants and grafts, initial encounter: Secondary | ICD-10-CM | POA: Diagnosis not present

## 2020-06-21 DIAGNOSIS — G4733 Obstructive sleep apnea (adult) (pediatric): Secondary | ICD-10-CM | POA: Diagnosis not present

## 2020-07-18 DIAGNOSIS — R7303 Prediabetes: Secondary | ICD-10-CM | POA: Diagnosis not present

## 2020-07-18 DIAGNOSIS — M545 Low back pain, unspecified: Secondary | ICD-10-CM | POA: Diagnosis not present

## 2020-07-18 DIAGNOSIS — N3090 Cystitis, unspecified without hematuria: Secondary | ICD-10-CM | POA: Diagnosis not present

## 2020-08-08 DIAGNOSIS — R059 Cough, unspecified: Secondary | ICD-10-CM | POA: Diagnosis not present

## 2020-08-08 DIAGNOSIS — U071 COVID-19: Secondary | ICD-10-CM | POA: Diagnosis not present

## 2020-08-08 DIAGNOSIS — R519 Headache, unspecified: Secondary | ICD-10-CM | POA: Diagnosis not present

## 2020-08-08 DIAGNOSIS — R439 Unspecified disturbances of smell and taste: Secondary | ICD-10-CM | POA: Diagnosis not present

## 2020-09-13 DIAGNOSIS — M65872 Other synovitis and tenosynovitis, left ankle and foot: Secondary | ICD-10-CM | POA: Diagnosis not present

## 2020-09-13 DIAGNOSIS — M25571 Pain in right ankle and joints of right foot: Secondary | ICD-10-CM | POA: Diagnosis not present

## 2020-09-13 DIAGNOSIS — M24672 Ankylosis, left ankle: Secondary | ICD-10-CM | POA: Diagnosis not present

## 2020-09-13 DIAGNOSIS — M659 Synovitis and tenosynovitis, unspecified: Secondary | ICD-10-CM | POA: Diagnosis not present

## 2020-09-13 DIAGNOSIS — G8918 Other acute postprocedural pain: Secondary | ICD-10-CM | POA: Diagnosis not present

## 2020-09-13 DIAGNOSIS — M13871 Other specified arthritis, right ankle and foot: Secondary | ICD-10-CM | POA: Diagnosis not present

## 2020-09-13 DIAGNOSIS — M24071 Loose body in right ankle: Secondary | ICD-10-CM | POA: Diagnosis not present

## 2020-09-13 DIAGNOSIS — T8484XA Pain due to internal orthopedic prosthetic devices, implants and grafts, initial encounter: Secondary | ICD-10-CM | POA: Diagnosis not present

## 2020-10-03 DIAGNOSIS — G4733 Obstructive sleep apnea (adult) (pediatric): Secondary | ICD-10-CM | POA: Diagnosis not present

## 2020-10-18 DIAGNOSIS — H401190 Primary open-angle glaucoma, unspecified eye, stage unspecified: Secondary | ICD-10-CM | POA: Diagnosis not present

## 2020-10-18 DIAGNOSIS — J09X2 Influenza due to identified novel influenza A virus with other respiratory manifestations: Secondary | ICD-10-CM | POA: Diagnosis not present

## 2020-10-18 DIAGNOSIS — E669 Obesity, unspecified: Secondary | ICD-10-CM | POA: Diagnosis not present

## 2020-10-18 DIAGNOSIS — H40059 Ocular hypertension, unspecified eye: Secondary | ICD-10-CM | POA: Diagnosis not present

## 2020-10-18 DIAGNOSIS — R7303 Prediabetes: Secondary | ICD-10-CM | POA: Diagnosis not present

## 2020-10-18 DIAGNOSIS — G4710 Hypersomnia, unspecified: Secondary | ICD-10-CM | POA: Diagnosis not present

## 2020-10-18 DIAGNOSIS — I10 Essential (primary) hypertension: Secondary | ICD-10-CM | POA: Diagnosis not present

## 2020-10-26 DIAGNOSIS — R7303 Prediabetes: Secondary | ICD-10-CM | POA: Diagnosis not present

## 2020-10-26 DIAGNOSIS — M7732 Calcaneal spur, left foot: Secondary | ICD-10-CM | POA: Diagnosis not present

## 2020-10-26 DIAGNOSIS — I10 Essential (primary) hypertension: Secondary | ICD-10-CM | POA: Diagnosis not present

## 2020-10-26 DIAGNOSIS — E559 Vitamin D deficiency, unspecified: Secondary | ICD-10-CM | POA: Diagnosis not present

## 2020-10-27 DIAGNOSIS — H43393 Other vitreous opacities, bilateral: Secondary | ICD-10-CM | POA: Diagnosis not present

## 2020-10-31 DIAGNOSIS — G4733 Obstructive sleep apnea (adult) (pediatric): Secondary | ICD-10-CM | POA: Diagnosis not present

## 2020-11-04 ENCOUNTER — Other Ambulatory Visit: Payer: Self-pay

## 2020-11-04 ENCOUNTER — Ambulatory Visit (HOSPITAL_COMMUNITY): Payer: BC Managed Care – PPO | Attending: Orthopedic Surgery

## 2020-11-04 ENCOUNTER — Encounter (HOSPITAL_COMMUNITY): Payer: Self-pay

## 2020-11-04 DIAGNOSIS — R262 Difficulty in walking, not elsewhere classified: Secondary | ICD-10-CM

## 2020-11-04 DIAGNOSIS — M25671 Stiffness of right ankle, not elsewhere classified: Secondary | ICD-10-CM | POA: Insufficient documentation

## 2020-11-04 DIAGNOSIS — R2681 Unsteadiness on feet: Secondary | ICD-10-CM | POA: Diagnosis not present

## 2020-11-04 NOTE — Patient Instructions (Signed)
Access Code: [email or web address] URL: [email or web address] Date: 11/04/2020 Prepared by: Shary Decamp  Exercises Long Sitting Calf Stretch with Strap - 1 x daily - 7 x weekly - 3 sets - 5 reps - 60 sec hold Gastroc Stretch on Wall - 1 x daily - 7 x weekly - 3 sets - 10 reps - 60 sec hold Soleus Stretch on Wall - 1 x daily - 7 x weekly - 3 sets - 10 reps - 60 sec hold

## 2020-11-04 NOTE — Therapy (Signed)
[facility] [address] [place], [place], [address] Phone: [phone]   Fax:  [phone]  Physical Therapy Evaluation  Patient Details  Name: Janice Gonzales MRN: [id number] Date of Birth: August 21, 1955 Referring Provider (PT): Dr. Toni Arthurs   Encounter Date: 11/04/2020   PT End of Session - 11/04/20 0905    Visit Number 1    Number of Visits 8    Date for PT Re-Evaluation 12/02/20    Authorization Type [employer]BS Comm PPO,    PT Start Time 0905    PT Stop Time 0950    PT Time Calculation (min) 45 min    Activity Tolerance Patient tolerated treatment well           Past Medical History:  Diagnosis Date  . Arthritis   . Glaucoma   . History of DVT of lower extremity    with PE  . Hyperlipidemia 07/05/2017  . Hypertension   . Post herpetic neuralgia 06/17/2017  . Sleep apnea     Past Surgical History:  Procedure Laterality Date  . ORIF ANKLE FRACTURE Right 01/06/2018   Procedure: OPEN REDUCTION INTERNAL FIXATION (ORIF) RIGHT ANKLE FRACTURE;  Surgeon: Vickki Hearing, MD;  Location: [facility];  Service: Orthopedics;  Laterality: Right;    There were no vitals filed for this visit.    Subjective Assessment - 11/04/20 0909    Subjective 2.5 years ago walking down stairs and mis-step off bottom bilmallolar fx and underwent ORIF. Right ankle pain/stiffness persists even after hardware removal on 09/13/20 and was NWB x 2 weeks and then in walking boot x 6 wks after that.    How long can you sit comfortably? 20-30 min notes right ankle stiffness and feeling locked    How long can you walk comfortably? 3-4 hours during shopping    Patient Stated Goals Be    Currently in Pain? No/denies              [facility] PT Assessment - 11/04/20 0001      Assessment   Medical Diagnosis Z47.89 Encounter for other orthopedic aftercare    Referring Provider (PT) Dr. Toni Arthurs      Balance Screen   Has the patient fallen in the past 6  months No    Has the patient had a decrease in activity level because of a fear of falling?  No    Is the patient reluctant to leave their home because of a fear of falling?  No      Prior Function   Level of Independence Independent    Vocation Full time employment    Vocation Requirements appliance sales lady at [employer]      Observation/Other Assessments   Observations poor squat mechanics, poor medial/lateral strength stability during heel raise. No pain with metatarsal squeeze      ROM / Strength   AROM / PROM / Strength Strength;AROM      AROM   AROM Assessment Site Ankle;Knee    Right/Left Knee Right;Left    Right/Left Ankle Right;Left    Right Ankle Dorsiflexion 11    Right Ankle Plantar Flexion 30    Left Ankle Dorsiflexion 22    Left Ankle Plantar Flexion 30      Strength   Strength Assessment Site Ankle;Knee    Right/Left Knee Right;Left    Right/Left Ankle Right;Left    Right Ankle Dorsiflexion 4/5    Right Ankle Plantar Flexion 3+/5  Right Ankle Inversion 3+/5    Right Ankle Eversion 3+/5    Left Ankle Dorsiflexion 5/5    Left Ankle Plantar Flexion 5/5    Left Ankle Inversion 5/5    Left Ankle Eversion 5/5      Special Tests   Other special tests +Achilles contracture      Ambulation/Gait   Ambulation/Gait Yes    Ambulation/Gait Assistance 7: Independent    Ambulation Distance (Feet) 300 Feet    Assistive device None    Gait Pattern Lateral trunk lean to right    Gait Comments                      Objective measurements completed on examination: See above findings.       [facility] Adult PT Treatment/Exercise - 11/04/20 0001      Exercises   Exercises Ankle      Ankle Exercises: Stretches   Soleus Stretch 2 reps;30 seconds    Gastroc Stretch 2 reps;30 seconds    Other Stretch gastroc stretch with towl 2x30 sec                  PT Education - 11/04/20 0956    Education Details pt education on exam  findings and HEP initiation    Person(s) Educated Patient    Methods Explanation;Demonstration;Handout    Comprehension Verbalized understanding;Verbal cues required;Tactile cues required;Need further instruction            PT Short Term Goals - 11/04/20 1007      PT SHORT TERM GOAL #1   Title Pt will have improved R ankle AROM by 5 deg throughout in order to maximize gait.    Baseline 11 degrees dorsiflexion    Time 2    Period Weeks    Status New    Target Date 11/18/20      PT SHORT TERM GOAL #2   Title Pt will have 1/2 grade improvement throughout ankle MMT in order to maximize gait and balance.    Baseline 3+/5 right ankle    Time 2    Period Weeks    Status New    Target Date 11/18/20      PT SHORT TERM GOAL #3   Title Patient will demo proper squat form 5/5 observations without cues to improve body mechanics    Baseline poor control and mechanics    Time 2    Period Weeks    Status New    Target Date 11/18/20             PT Long Term Goals - 11/04/20 1009      PT LONG TERM GOAL #1   Title Demo improved right ankle dorsiflexion to 18 degrees to normalize gait mechanics    Baseline 11    Time 4    Period Weeks    Status New    Target Date 12/02/20      PT LONG TERM GOAL #2   Title Demo improved ambulation velocity and tolerance as evidenced by 400 ft during    Baseline 300    Time 4    Period Weeks    Status New    Target Date 12/02/20                  Plan - 11/04/20 1005    Clinical Impression Statement Patient is a  65 yo lady presenting to physical therapy with c/o right ankle stifness and  limited ROM. She presents with pain limited deficits in RLE strength, ROM, endurance, postural impairments, and functional mobility with ADL. She is having to modify and restrict ADL as indicated by and ROM limits score as well as subjective information and objective measures which is affecting overall participation. Patient will benefit from  skilled physical therapy in order to improve function and reduce impairment.    Personal Factors and Comorbidities Time since onset of injury/illness/exacerbation    Examination-Activity Limitations Bend;Stairs;Squat;Locomotion Level    Examination-Participation Restrictions Occupation;Yard Work    Stability/Clinical Decision Making Stable/Uncomplicated    Optometrist Low    Rehab Potential Good    PT Frequency 2x / week    PT Duration 4 weeks    PT Treatment/Interventions ADLs/Self Care Home Management;Aquatic Therapy;Cryotherapy;Electrical Stimulation;DME Instruction;Ultrasound;Traction;Gait training;Stair training;Functional mobility training;Therapeutic activities;Therapeutic exercise;Balance training;Orthotic Fit/Training;Patient/family education;Neuromuscular re-education;Manual techniques;Scar mobilization;Passive range of motion;Taping;Energy conservation;Dry needling;Joint Manipulations    PT Next Visit Plan Manual therapy to Right ankle, backwards walking    PT Home Exercise Plan calf stretch with towel, standing gastroc/soleus stretch    Consulted and Agree with Plan of Care Patient           Patient will benefit from skilled therapeutic intervention in order to improve the following deficits and impairments:  Abnormal gait,Decreased balance,Decreased mobility,Decreased range of motion,Decreased strength,Hypomobility,Difficulty walking,Impaired flexibility,Improper body mechanics,Pain  Visit Diagnosis: Stiffness of right ankle, not elsewhere classified  Difficulty in walking, not elsewhere classified  Unsteadiness on feet     Problem List Patient Active Problem List   Diagnosis Date Noted  . S/P ORIF (open reduction internal fixation) fracture right ankle 01/06/18 01/09/2018  . Closed trimalleolar fracture of right ankle   . Hyperlipidemia 07/05/2017  . Vitamin D deficiency 07/05/2017  . Essential hypertension 06/17/2017  . Post herpetic neuralgia  06/17/2017  . History of pulmonary embolism 06/17/2017   10:12 AM, 11/04/20 M. Shary Decamp, PT, DPT Physical Therapist- [place] Office Number: [phone]  [facility] [address] [place], [place], [address] Phone: [phone]   Fax:  [phone]  Name: Janice Gonzales MRN: [id number] Date of Birth: 02-Jul-1956

## 2020-11-08 ENCOUNTER — Ambulatory Visit (HOSPITAL_COMMUNITY): Payer: BC Managed Care – PPO

## 2020-11-08 ENCOUNTER — Other Ambulatory Visit: Payer: Self-pay

## 2020-11-08 DIAGNOSIS — R262 Difficulty in walking, not elsewhere classified: Secondary | ICD-10-CM | POA: Diagnosis not present

## 2020-11-08 DIAGNOSIS — R2681 Unsteadiness on feet: Secondary | ICD-10-CM | POA: Diagnosis not present

## 2020-11-08 DIAGNOSIS — M25671 Stiffness of right ankle, not elsewhere classified: Secondary | ICD-10-CM

## 2020-11-08 NOTE — Therapy (Signed)
[place] [facility] [address] [place], [place], [address] Phone: [phone]   Fax:  [phone]  Physical Therapy Treatment  Patient Details  Name: Janice Gonzales MRN: [id number] Date of Birth: Dec 16, 1955 Referring Provider (PT): Dr. Toni Arthurs   Encounter Date: 11/08/2020   PT End of Session - 11/08/20 1128    Visit Number 2    Number of Visits 8    Date for PT Re-Evaluation 12/02/20    Authorization Type [employer] Comm PPO,    PT Start Time 1123    PT Stop Time 1203    PT Time Calculation (min) 40 min    Activity Tolerance Patient tolerated treatment well           Past Medical History:  Diagnosis Date  . Arthritis   . Glaucoma   . History of DVT of lower extremity    with PE  . Hyperlipidemia 07/05/2017  . Hypertension   . Post herpetic neuralgia 06/17/2017  . Sleep apnea     Past Surgical History:  Procedure Laterality Date  . ORIF ANKLE FRACTURE Right 01/06/2018   Procedure: OPEN REDUCTION INTERNAL FIXATION (ORIF) RIGHT ANKLE FRACTURE;  Surgeon: Vickki Hearing, MD;  Location: [facility];  Service: Orthopedics;  Laterality: Right;    There were no vitals filed for this visit.   Subjective Assessment - 11/08/20 1209    Subjective Pt reports she went back to work recently and noticed ankle soreness and swelling following the end of her shift    How long can you sit comfortably? 20-30 min notes right ankle stiffness and feeling locked    How long can you walk comfortably? 3-4 hours during shopping    Patient Stated Goals Be    Currently in Pain? No/denies              [facility] PT Assessment - 11/08/20 0001      Assessment   Medical Diagnosis Z47.89 Encounter for other orthopedic aftercare    Referring Provider (PT) Dr. Toni Arthurs                         [facility] Adult PT Treatment/Exercise - 11/08/20 0001      Manual Therapy   Manual Therapy Joint mobilization    Manual therapy comments completed  separate from all other interventions    Joint Mobilization joint mobilizations to right ankle to improve dorsiflexion and eversion ROM with performance of posterior glides grade 3 and distration to mortise joint coupled with overpressure to dorsiflexion      Ankle Exercises: Stretches   Other Stretch standing right ankle dorsfielxion mobilization with 12 inch step performing knee drive with foot flate 4P80 sec                  PT Education - 11/08/20 1209    Education Details instructed in HEP additions for squat form and HEP additions    Person(s) Educated Patient    Methods Explanation;Demonstration    Comprehension Verbalized understanding;Returned demonstration            PT Short Term Goals - 11/04/20 1007      PT SHORT TERM GOAL #1   Title Pt will have improved R ankle AROM by 5 deg throughout in order to maximize gait.    Baseline 11 degrees dorsiflexion    Time 2    Period Weeks    Status New    Target Date 11/18/20  PT SHORT TERM GOAL #2   Title Pt will have 1/2 grade improvement throughout ankle MMT in order to maximize gait and balance.    Baseline 3+/5 right ankle    Time 2    Period Weeks    Status New    Target Date 11/18/20      PT SHORT TERM GOAL #3   Title Patient will demo proper squat form 5/5 observations without cues to improve body mechanics    Baseline poor control and mechanics    Time 2    Period Weeks    Status New    Target Date 11/18/20             PT Long Term Goals - 11/04/20 1009      PT LONG TERM GOAL #1   Title Demo improved right ankle dorsiflexion to 18 degrees to normalize gait mechanics    Baseline 11    Time 4    Period Weeks    Status New    Target Date 12/02/20      PT LONG TERM GOAL #2   Title Demo improved ambulation velocity and tolerance as evidenced by 400 ft during    Baseline 300    Time 4    Period Weeks    Status New    Target Date 12/02/20                 Plan - 11/08/20  1212    Clinical Impression Statement Tolerating tx session well and demo good HEP recitation.  Tolerated manual therapy well and educated on benefits and time line for tissue healing/change for increased flexibility and joint mechanics.  Will attempt more robust ankle traction/mobilization with belt at next session    Personal Factors and Comorbidities Time since onset of injury/illness/exacerbation    Examination-Activity Limitations Bend;Stairs;Squat;Locomotion Level    Examination-Participation Restrictions Occupation;Yard Work    Stability/Clinical Decision Making Stable/Uncomplicated    Rehab Potential Good    PT Frequency 2x / week    PT Duration 4 weeks    PT Treatment/Interventions ADLs/Self Care Home Management;Aquatic Therapy;Cryotherapy;Electrical Stimulation;DME Instruction;Ultrasound;Traction;Gait training;Stair training;Functional mobility training;Therapeutic activities;Therapeutic exercise;Balance training;Orthotic Fit/Training;Patient/family education;Neuromuscular re-education;Manual techniques;Scar mobilization;Passive range of motion;Taping;Energy conservation;Dry needling;Joint Manipulations    PT Next Visit Plan Manual therapy to Right ankle, backwards walking, mobilization belt    PT Home Exercise Plan calf stretch with towel, standing gastroc/soleus stretch, DF mobilization, squat form    Consulted and Agree with Plan of Care Patient           Patient will benefit from skilled therapeutic intervention in order to improve the following deficits and impairments:  Abnormal gait,Decreased balance,Decreased mobility,Decreased range of motion,Decreased strength,Hypomobility,Difficulty walking,Impaired flexibility,Improper body mechanics,Pain  Visit Diagnosis: Stiffness of right ankle, not elsewhere classified  Difficulty in walking, not elsewhere classified  Unsteadiness on feet     Problem List Patient Active Problem List   Diagnosis Date Noted  . S/P ORIF (open  reduction internal fixation) fracture right ankle 01/06/18 01/09/2018  . Closed trimalleolar fracture of right ankle   . Hyperlipidemia 07/05/2017  . Vitamin D deficiency 07/05/2017  . Essential hypertension 06/17/2017  . Post herpetic neuralgia 06/17/2017  . History of pulmonary embolism 06/17/2017   12:15 PM, 11/08/20 M. Shary Decamp, PT, DPT Physical Therapist- [place] Office Number: [phone]  [facility] [address] [place], [place], [address] Phone: [phone]   Fax:  [phone]  Name: Janice Gonzales MRN: [id number] Date  of Birth: [birth year]

## 2020-11-11 ENCOUNTER — Encounter (HOSPITAL_COMMUNITY): Payer: BC Managed Care – PPO

## 2020-11-14 DIAGNOSIS — H35033 Hypertensive retinopathy, bilateral: Secondary | ICD-10-CM | POA: Diagnosis not present

## 2020-11-14 DIAGNOSIS — H35373 Puckering of macula, bilateral: Secondary | ICD-10-CM | POA: Diagnosis not present

## 2020-11-14 DIAGNOSIS — H43813 Vitreous degeneration, bilateral: Secondary | ICD-10-CM | POA: Diagnosis not present

## 2020-11-14 DIAGNOSIS — H35362 Drusen (degenerative) of macula, left eye: Secondary | ICD-10-CM | POA: Diagnosis not present

## 2020-11-16 ENCOUNTER — Other Ambulatory Visit: Payer: Self-pay

## 2020-11-16 ENCOUNTER — Encounter (HOSPITAL_COMMUNITY): Payer: Self-pay

## 2020-11-16 ENCOUNTER — Ambulatory Visit (HOSPITAL_COMMUNITY): Payer: BC Managed Care – PPO

## 2020-11-16 DIAGNOSIS — R2681 Unsteadiness on feet: Secondary | ICD-10-CM

## 2020-11-16 DIAGNOSIS — M25671 Stiffness of right ankle, not elsewhere classified: Secondary | ICD-10-CM

## 2020-11-16 DIAGNOSIS — R262 Difficulty in walking, not elsewhere classified: Secondary | ICD-10-CM

## 2020-11-16 NOTE — Therapy (Signed)
[place] [facility] [address] [place], [place], [address] Phone: [phone]   Fax:  [phone]  Physical Therapy Treatment  Patient Details  Name: Janice Gonzales MRN: [id number] Date of Birth: [birth year] Referring Provider (PT): Dr. Toni Arthurs   Encounter Date: 11/16/2020   PT End of Session - 11/16/20 1735    Visit Number 3    Number of Visits 8    Date for PT Re-Evaluation 12/02/20    Authorization Type [employer] Comm PPO,    PT Start Time 1645    PT Stop Time 1730    PT Time Calculation (min) 45 min    Activity Tolerance Patient tolerated treatment well           Past Medical History:  Diagnosis Date  . Arthritis   . Glaucoma   . History of DVT of lower extremity    with PE  . Hyperlipidemia 07/05/2017  . Hypertension   . Post herpetic neuralgia 06/17/2017  . Sleep apnea     Past Surgical History:  Procedure Laterality Date  . ORIF ANKLE FRACTURE Right 01/06/2018   Procedure: OPEN REDUCTION INTERNAL FIXATION (ORIF) RIGHT ANKLE FRACTURE;  Surgeon: Vickki Hearing, MD;  Location: [facility];  Service: Orthopedics;  Laterality: Right;    There were no vitals filed for this visit.   Subjective Assessment - 11/16/20 1650    Subjective Pt reports improving soreness in her ankle but notes increased stiffness after prolonged sitting or standing    How long can you sit comfortably? 20-30 min notes right ankle stiffness and feeling locked    How long can you walk comfortably? 3-4 hours during shopping    Patient Stated Goals Be                             [facility] Adult PT Treatment/Exercise - 11/16/20 0001      Manual Therapy   Manual Therapy Joint mobilization    Manual therapy comments completed separate from all other interventions    Joint Mobilization joint mobilizations to right ankle to improve dorsiflexion and eversion ROM with performance of posterior glides grade 3 and distration to mortise joint  coupled with overpressure to dorsiflexion      Ankle Exercises: Standing   Heel Raises Both;20 reps   with rolled up towel under ball of foot to plantar fascia stretch   Toe Raise 20 reps   leaning against the wall for tibialis raise                 PT Education - 11/16/20 1735    Education Details education in HEP additions    Person(s) Educated Patient    Methods Explanation    Comprehension Verbalized understanding            PT Short Term Goals - 11/04/20 1007      PT SHORT TERM GOAL #1   Title Pt will have improved R ankle AROM by 5 deg throughout in order to maximize gait.    Baseline 11 degrees dorsiflexion    Time 2    Period Weeks    Status New    Target Date 11/18/20      PT SHORT TERM GOAL #2   Title Pt will have 1/2 grade improvement throughout ankle MMT in order to maximize gait and balance.    Baseline 3+/5 right ankle    Time 2    Period  Weeks    Status New    Target Date 11/18/20      PT SHORT TERM GOAL #3   Title Patient will demo proper squat form 5/5 observations without cues to improve body mechanics    Baseline poor control and mechanics    Time 2    Period Weeks    Status New    Target Date 11/18/20             PT Long Term Goals - 11/04/20 1009      PT LONG TERM GOAL #1   Title Demo improved right ankle dorsiflexion to 18 degrees to normalize gait mechanics    Baseline 11    Time 4    Period Weeks    Status New    Target Date 12/02/20      PT LONG TERM GOAL #2   Title Demo improved ambulation velocity and tolerance as evidenced by 400 ft during    Baseline 300    Time 4    Period Weeks    Status New    Target Date 12/02/20                 Plan - 11/16/20 1735    Clinical Impression Statement Tolerated tx session well with difficulty performing tibialis raises requiring compensatory movements to accommodate. Good tolerance to joint mobilizations in traction and overpressure to dorsiflexion. Continued POC  indicated to improve right ankle ROM and enable improved standing/walking tolerance    Personal Factors and Comorbidities Time since onset of injury/illness/exacerbation    Examination-Activity Limitations Bend;Stairs;Squat;Locomotion Level    Examination-Participation Restrictions Occupation;Yard Work    Stability/Clinical Decision Making Stable/Uncomplicated    Rehab Potential Good    PT Frequency 2x / week    PT Duration 4 weeks    PT Treatment/Interventions ADLs/Self Care Home Management;Aquatic Therapy;Cryotherapy;Electrical Stimulation;DME Instruction;Ultrasound;Traction;Gait training;Stair training;Functional mobility training;Therapeutic activities;Therapeutic exercise;Balance training;Orthotic Fit/Training;Patient/family education;Neuromuscular re-education;Manual techniques;Scar mobilization;Passive range of motion;Taping;Energy conservation;Dry needling;Joint Manipulations    PT Next Visit Plan Manual therapy to Right ankle, backwards walking, mobilization belt. DF mobilization with foot elevated on step with strap pulling for posterior glide    PT Home Exercise Plan calf stretch with towel, standing gastroc/soleus stretch, DF mobilization, squat form, tibialis raises    Consulted and Agree with Plan of Care Patient           Patient will benefit from skilled therapeutic intervention in order to improve the following deficits and impairments:  Abnormal gait,Decreased balance,Decreased mobility,Decreased range of motion,Decreased strength,Hypomobility,Difficulty walking,Impaired flexibility,Improper body mechanics,Pain  Visit Diagnosis: Stiffness of right ankle, not elsewhere classified  Difficulty in walking, not elsewhere classified  Unsteadiness on feet     Problem List Patient Active Problem List   Diagnosis Date Noted  . S/P ORIF (open reduction internal fixation) fracture right ankle 01/06/18 01/09/2018  . Closed trimalleolar fracture of right ankle   . Hyperlipidemia  07/05/2017  . Vitamin D deficiency 07/05/2017  . Essential hypertension 06/17/2017  . Post herpetic neuralgia 06/17/2017  . History of pulmonary embolism 06/17/2017   5:38 PM, 11/16/20 M. Shary Decamp, PT, DPT Physical Therapist- [place] Office Number: [phone]  [facility] [address] [place], [place], [address] Phone: [phone]   Fax:  [phone]  Name: JAHNYLA PARRILLO MRN: [id number] Date of Birth: 05-29-1956

## 2020-11-18 ENCOUNTER — Encounter (HOSPITAL_COMMUNITY): Payer: Self-pay | Admitting: Physical Therapy

## 2020-11-18 ENCOUNTER — Other Ambulatory Visit: Payer: Self-pay

## 2020-11-18 ENCOUNTER — Ambulatory Visit (HOSPITAL_COMMUNITY): Payer: BC Managed Care – PPO | Attending: Orthopedic Surgery | Admitting: Physical Therapy

## 2020-11-18 DIAGNOSIS — R2681 Unsteadiness on feet: Secondary | ICD-10-CM | POA: Insufficient documentation

## 2020-11-18 DIAGNOSIS — M25671 Stiffness of right ankle, not elsewhere classified: Secondary | ICD-10-CM | POA: Insufficient documentation

## 2020-11-18 DIAGNOSIS — R262 Difficulty in walking, not elsewhere classified: Secondary | ICD-10-CM | POA: Insufficient documentation

## 2020-11-18 NOTE — Therapy (Signed)
[place] [facility] [address] [place], [place], [address] Phone: [phone]   Fax:  [phone]  Physical Therapy Treatment  Patient Details  Name: Janice Gonzales MRN: [id number] Date of Birth: 10-Jan-1956 Referring Provider (PT): Dr. Toni Arthurs   Encounter Date: 11/18/2020   PT End of Session - 11/18/20 0859    Visit Number 4    Number of Visits 8    Date for PT Re-Evaluation 12/02/20    Authorization Type [employer] Comm PPO,    PT Start Time 0818    PT Stop Time 0858    PT Time Calculation (min) 40 min    Activity Tolerance Patient tolerated treatment well           Past Medical History:  Diagnosis Date  . Arthritis   . Glaucoma   . History of DVT of lower extremity    with PE  . Hyperlipidemia 07/05/2017  . Hypertension   . Post herpetic neuralgia 06/17/2017  . Sleep apnea     Past Surgical History:  Procedure Laterality Date  . ORIF ANKLE FRACTURE Right 01/06/2018   Procedure: OPEN REDUCTION INTERNAL FIXATION (ORIF) RIGHT ANKLE FRACTURE;  Surgeon: Vickki Hearing, MD;  Location: [facility];  Service: Orthopedics;  Laterality: Right;    There were no vitals filed for this visit.                      [facility] Adult PT Treatment/Exercise - 11/18/20 0001      Manual Therapy   Manual Therapy Joint mobilization    Manual therapy comments completed separate from all other interventions    Joint Mobilization metatarsal mobs      Ankle Exercises: Standing   Heel Walk (Round Trip) with plates under feet 2x50 ft    Other Standing Ankle Exercises beanie bag elevator crossing midline to put into 8" box    Other Standing Ankle Exercises Mobilization with bungie on 12" box                  PT Education - 11/18/20 0859    Education Details HEP: plate walks, beanie bag elevator, metatarsal mobs    Person(s) Educated Patient    Methods Explanation;Demonstration    Comprehension Verbalized  understanding;Returned demonstration            PT Short Term Goals - 11/04/20 1007      PT SHORT TERM GOAL #1   Title Pt will have improved R ankle AROM by 5 deg throughout in order to maximize gait.    Baseline 11 degrees dorsiflexion    Time 2    Period Weeks    Status New    Target Date 11/18/20      PT SHORT TERM GOAL #2   Title Pt will have 1/2 grade improvement throughout ankle MMT in order to maximize gait and balance.    Baseline 3+/5 right ankle    Time 2    Period Weeks    Status New    Target Date 11/18/20      PT SHORT TERM GOAL #3   Title Patient will demo proper squat form 5/5 observations without cues to improve body mechanics    Baseline poor control and mechanics    Time 2    Period Weeks    Status New    Target Date 11/18/20             PT Long Term Goals -  11/04/20 1009      PT LONG TERM GOAL #1   Title Demo improved right ankle dorsiflexion to 18 degrees to normalize gait mechanics    Baseline 11    Time 4    Period Weeks    Status New    Target Date 12/02/20      PT LONG TERM GOAL #2   Title Demo improved ambulation velocity and tolerance as evidenced by 400 ft during    Baseline 300    Time 4    Period Weeks    Status New    Target Date 12/02/20                 Plan - 11/18/20 0900    Clinical Impression Statement Reports little pain but increased stiffness, espeically after sititng for >20 min. Demo good participation with beanie bag elevator, but cont difficulty with co-activation of great toe extensor and preference for supination compensaion when fatigued. Demo increased fatigue but no loss of plates with plate walking under heels for TibA activation, despite reports of increased fatigue. Noted great difference between active DF between L and R throughout walks and when completing beanie bag elevator with L foot. Continued POC for mobility and strength    Personal Factors and Comorbidities Time since onset of  injury/illness/exacerbation    Examination-Activity Limitations Bend;Stairs;Squat;Locomotion Level    Examination-Participation Restrictions Occupation;Yard Work    Stability/Clinical Decision Making Stable/Uncomplicated    Rehab Potential Good    PT Frequency 2x / week    PT Duration 4 weeks    PT Treatment/Interventions ADLs/Self Care Home Management;Aquatic Therapy;Cryotherapy;Electrical Stimulation;DME Instruction;Ultrasound;Traction;Gait training;Stair training;Functional mobility training;Therapeutic activities;Therapeutic exercise;Balance training;Orthotic Fit/Training;Patient/family education;Neuromuscular re-education;Manual techniques;Scar mobilization;Passive range of motion;Taping;Energy conservation;Dry needling;Joint Manipulations    PT Next Visit Plan Manual therapy to Right ankle, backwards walking, mobilization belt. DF mobilization with foot elevated on step with strap pulling for posterior glide    PT Home Exercise Plan calf stretch with towel, standing gastroc/soleus stretch, DF mobilization, squat form, tibialis raises. 4/1: plate walks, beanie bag elevator, metatarsal mobilizations.    Consulted and Agree with Plan of Care Patient           Patient will benefit from skilled therapeutic intervention in order to improve the following deficits and impairments:  Abnormal gait,Decreased balance,Decreased mobility,Decreased range of motion,Decreased strength,Hypomobility,Difficulty walking,Impaired flexibility,Improper body mechanics,Pain  Visit Diagnosis: Stiffness of right ankle, not elsewhere classified  Difficulty in walking, not elsewhere classified  Unsteadiness on feet     Problem List Patient Active Problem List   Diagnosis Date Noted  . S/P ORIF (open reduction internal fixation) fracture right ankle 01/06/18 01/09/2018  . Closed trimalleolar fracture of right ankle   . Hyperlipidemia 07/05/2017  . Vitamin D deficiency 07/05/2017  . Essential hypertension  06/17/2017  . Post herpetic neuralgia 06/17/2017  . History of pulmonary embolism 06/17/2017    9:06 AM,11/18/20 Esmeralda Links, PT, DPT Physical Therapist at [facility] [address] [place], [place], [address] Phone: [phone]   Fax:  [phone]  Name: Janice Gonzales MRN: [id number] Date of Birth: [birth year]

## 2020-11-21 ENCOUNTER — Ambulatory Visit (HOSPITAL_COMMUNITY): Payer: BC Managed Care – PPO

## 2020-11-21 ENCOUNTER — Other Ambulatory Visit: Payer: Self-pay

## 2020-11-21 DIAGNOSIS — M25671 Stiffness of right ankle, not elsewhere classified: Secondary | ICD-10-CM

## 2020-11-21 DIAGNOSIS — R2681 Unsteadiness on feet: Secondary | ICD-10-CM

## 2020-11-21 DIAGNOSIS — R262 Difficulty in walking, not elsewhere classified: Secondary | ICD-10-CM | POA: Diagnosis not present

## 2020-11-21 NOTE — Therapy (Signed)
[facility] [address] [place], [place], [address] Phone: [phone]   Fax:  [phone]  Physical Therapy Treatment  Patient Details  Name: Janice Gonzales MRN: [id number] Date of Birth: [birth year] Referring Provider (PT): Dr. Toni Arthurs   Encounter Date: 11/21/2020   PT End of Session - 11/21/20 1442    Visit Number 5    Number of Visits 8    Date for PT Re-Evaluation 12/02/20    Authorization Type [employer] Comm PPO,    PT Start Time 1345    PT Stop Time 1435    PT Time Calculation (min) 50 min    Activity Tolerance Patient tolerated treatment well    Behavior During Therapy [facility] for tasks assessed/performed           Past Medical History:  Diagnosis Date  . Arthritis   . Glaucoma   . History of DVT of lower extremity    with PE  . Hyperlipidemia 07/05/2017  . Hypertension   . Post herpetic neuralgia 06/17/2017  . Sleep apnea     Past Surgical History:  Procedure Laterality Date  . ORIF ANKLE FRACTURE Right 01/06/2018   Procedure: OPEN REDUCTION INTERNAL FIXATION (ORIF) RIGHT ANKLE FRACTURE;  Surgeon: Vickki Hearing, MD;  Location: [facility];  Service: Orthopedics;  Laterality: Right;    There were no vitals filed for this visit.   Subjective Assessment - 11/21/20 1355    Subjective Reports now working her regular 8 hour shift and notes some ankle pain with prolonged activity.    How long can you sit comfortably? 20-30 min notes right ankle stiffness and feeling locked    How long can you walk comfortably? 3-4 hours during shopping    Patient Stated Goals Be    Currently in Pain? Yes    Pain Score 2     Pain Location Ankle    Pain Orientation Right    Pain Descriptors / Indicators Aching;Sore              [facility] PT Assessment - 11/21/20 0001      Assessment   Medical Diagnosis Z47.89 Encounter for other orthopedic aftercare    Referring Provider (PT) Dr. Toni Arthurs                          [facility] Adult PT Treatment/Exercise - 11/21/20 0001      Manual Therapy   Manual Therapy Joint mobilization    Manual therapy comments completed separate from all other interventions    Joint Mobilization traction and posterior glides right ankle to increase dorsiflexion      Ankle Exercises: Standing   Rocker Board 4 minutes   lateral and ant-post   Balance Master: Static static standing on airex pad x 2 min and withstanding perturbations    Heel Raises Both;20 reps    Other Standing Ankle Exercises tall kneeling stretch for dorsiflexion 2 min    Other Standing Ankle Exercises Mobilization with bungie on 12" box   2x2 min                 PT Education - 11/21/20 1441    Education Details education on timeline for improved flexibility and ROM    Person(s) Educated Patient    Methods Explanation    Comprehension Verbalized understanding            PT Short Term Goals - 11/04/20 1007  PT SHORT TERM GOAL #1   Title Pt will have improved R ankle AROM by 5 deg throughout in order to maximize gait.    Baseline 11 degrees dorsiflexion    Time 2    Period Weeks    Status New    Target Date 11/18/20      PT SHORT TERM GOAL #2   Title Pt will have 1/2 grade improvement throughout ankle MMT in order to maximize gait and balance.    Baseline 3+/5 right ankle    Time 2    Period Weeks    Status New    Target Date 11/18/20      PT SHORT TERM GOAL #3   Title Patient will demo proper squat form 5/5 observations without cues to improve body mechanics    Baseline poor control and mechanics    Time 2    Period Weeks    Status New    Target Date 11/18/20             PT Long Term Goals - 11/04/20 1009      PT LONG TERM GOAL #1   Title Demo improved right ankle dorsiflexion to 18 degrees to normalize gait mechanics    Baseline 11    Time 4    Period Weeks    Status New    Target Date 12/02/20      PT LONG TERM GOAL #2   Title Demo  improved ambulation velocity and tolerance as evidenced by 400 ft during    Baseline 300    Time 4    Period Weeks    Status New    Target Date 12/02/20                 Plan - 11/21/20 1442    Clinical Impression Statement Tolerating tx sessions well with continued report of right ankle stiffness and soreness especially when immobile for 20-30 minutes. Achilles tightness/limited flexibility appreciated Right ankle    Personal Factors and Comorbidities Time since onset of injury/illness/exacerbation    Examination-Activity Limitations Bend;Stairs;Squat;Locomotion Level    Examination-Participation Restrictions Occupation;Yard Work    Stability/Clinical Decision Making Stable/Uncomplicated    Rehab Potential Good    PT Frequency 2x / week    PT Duration 4 weeks    PT Treatment/Interventions ADLs/Self Care Home Management;Aquatic Therapy;Cryotherapy;Electrical Stimulation;DME Instruction;Ultrasound;Traction;Gait training;Stair training;Functional mobility training;Therapeutic activities;Therapeutic exercise;Balance training;Orthotic Fit/Training;Patient/family education;Neuromuscular re-education;Manual techniques;Scar mobilization;Passive range of motion;Taping;Energy conservation;Dry needling;Joint Manipulations    PT Next Visit Plan Manual therapy to Right ankle, backwards walking, mobilization belt. DF mobilization with foot elevated on step with strap pulling for posterior glide    PT Home Exercise Plan calf stretch with towel, standing gastroc/soleus stretch, DF mobilization, squat form, tibialis raises. 4/1: plate walks, beanie bag elevator, metatarsal mobilizations.    Consulted and Agree with Plan of Care Patient           Patient will benefit from skilled therapeutic intervention in order to improve the following deficits and impairments:  Abnormal gait,Decreased balance,Decreased mobility,Decreased range of motion,Decreased strength,Hypomobility,Difficulty  walking,Impaired flexibility,Improper body mechanics,Pain  Visit Diagnosis: Stiffness of right ankle, not elsewhere classified  Difficulty in walking, not elsewhere classified  Unsteadiness on feet     Problem List Patient Active Problem List   Diagnosis Date Noted  . S/P ORIF (open reduction internal fixation) fracture right ankle 01/06/18 01/09/2018  . Closed trimalleolar fracture of right ankle   . Hyperlipidemia 07/05/2017  . Vitamin D deficiency 07/05/2017  .  Essential hypertension 06/17/2017  . Post herpetic neuralgia 06/17/2017  . History of pulmonary embolism 06/17/2017   2:46 PM, 11/21/20 M. Shary Decamp, PT, DPT Physical Therapist- [place] Office Number: [phone]  [facility] [address] [place], [place], [address] Phone: [phone]   Fax:  [phone]  Name: Janice Gonzales MRN: [id number] Date of Birth: [birth year]

## 2020-11-23 ENCOUNTER — Ambulatory Visit (HOSPITAL_COMMUNITY): Payer: BC Managed Care – PPO

## 2020-11-23 ENCOUNTER — Other Ambulatory Visit: Payer: Self-pay

## 2020-11-23 ENCOUNTER — Encounter (HOSPITAL_COMMUNITY): Payer: Self-pay

## 2020-11-23 DIAGNOSIS — M25671 Stiffness of right ankle, not elsewhere classified: Secondary | ICD-10-CM

## 2020-11-23 DIAGNOSIS — R2681 Unsteadiness on feet: Secondary | ICD-10-CM

## 2020-11-23 DIAGNOSIS — R262 Difficulty in walking, not elsewhere classified: Secondary | ICD-10-CM

## 2020-11-23 NOTE — Therapy (Signed)
[facility] [address] [place], [place], [address] Phone: [phone]   Fax:  [phone]  Physical Therapy Treatment  Patient Details  Name: Janice Gonzales MRN: [id number] Date of Birth: 1956-07-31 Referring Provider (PT): Dr. Toni Arthurs   Encounter Date: 11/23/2020   PT End of Session - 11/23/20 0909    Visit Number 6    Number of Visits 8    Date for PT Re-Evaluation 12/02/20    Authorization Type [employer] Comm PPO,    PT Start Time 0909    PT Stop Time 0945    PT Time Calculation (min) 36 min    Activity Tolerance Patient tolerated treatment well    Behavior During Therapy [facility] for tasks assessed/performed           Past Medical History:  Diagnosis Date  . Arthritis   . Glaucoma   . History of DVT of lower extremity    with PE  . Hyperlipidemia 07/05/2017  . Hypertension   . Post herpetic neuralgia 06/17/2017  . Sleep apnea     Past Surgical History:  Procedure Laterality Date  . ORIF ANKLE FRACTURE Right 01/06/2018   Procedure: OPEN REDUCTION INTERNAL FIXATION (ORIF) RIGHT ANKLE FRACTURE;  Surgeon: Vickki Hearing, MD;  Location: [facility];  Service: Orthopedics;  Laterality: Right;    There were no vitals filed for this visit.   Subjective Assessment - 11/23/20 0910    Subjective Pt reports soreness after her 8 hour work shift    How long can you sit comfortably? 20-30 min notes right ankle stiffness and feeling locked    How long can you walk comfortably? 3-4 hours during shopping    Patient Stated Goals Be    Currently in Pain? Yes    Pain Score 3     Pain Location Ankle              [facility] PT Assessment - 11/23/20 0001      Assessment   Medical Diagnosis Z47.89 Encounter for other orthopedic aftercare    Referring Provider (PT) Dr. Toni Arthurs                         [facility] Adult PT Treatment/Exercise - 11/23/20 0001      Manual Therapy   Manual Therapy Joint mobilization     Manual therapy comments completed separate from all other interventions    Joint Mobilization traction and posterior glides right ankle to increase dorsiflexion      Ankle Exercises: Standing   Balance Master: Static static standing on airex pad x 2 min and withstanding perturbations. Single leg stance airex pad x 60 sec    Heel Raises Both   3x10   Toe Raise 2 seconds   3x10 tib raises against the wal     Ankle Exercises: Stretches   Gastroc Stretch 2 reps;60 seconds   edge of step   Slant Board Stretch 2 reps;60 seconds    Other Stretch tall kneeling DF stretch (knee drive to wall 64Q                  PT Education - 11/23/20 0921    Education Details reinforcement of daily HEP    Person(s) Educated Patient    Methods Explanation    Comprehension Verbalized understanding            PT Short Term Goals - 11/04/20 1007  PT SHORT TERM GOAL #1   Title Pt will have improved R ankle AROM by 5 deg throughout in order to maximize gait.    Baseline 11 degrees dorsiflexion    Time 2    Period Weeks    Status New    Target Date 11/18/20      PT SHORT TERM GOAL #2   Title Pt will have 1/2 grade improvement throughout ankle MMT in order to maximize gait and balance.    Baseline 3+/5 right ankle    Time 2    Period Weeks    Status New    Target Date 11/18/20      PT SHORT TERM GOAL #3   Title Patient will demo proper squat form 5/5 observations without cues to improve body mechanics    Baseline poor control and mechanics    Time 2    Period Weeks    Status New    Target Date 11/18/20             PT Long Term Goals - 11/04/20 1009      PT LONG TERM GOAL #1   Title Demo improved right ankle dorsiflexion to 18 degrees to normalize gait mechanics    Baseline 11    Time 4    Period Weeks    Status New    Target Date 12/02/20      PT LONG TERM GOAL #2   Title Demo improved ambulation velocity and tolerance as evidenced by 400 ft during    Baseline 300     Time 4    Period Weeks    Status New    Target Date 12/02/20                 Plan - 11/23/20 0947    Clinical Impression Statement TOlerating sessions well. Continues to have tightness in Achilles limiting DF. Able to achieve 11 degrees dorsiflexion in tall kneeling position    Personal Factors and Comorbidities Time since onset of injury/illness/exacerbation    Examination-Activity Limitations Bend;Stairs;Squat;Locomotion Level    Examination-Participation Restrictions Occupation;Yard Work    Stability/Clinical Decision Making Stable/Uncomplicated    Rehab Potential Good    PT Frequency 2x / week    PT Duration 4 weeks    PT Treatment/Interventions ADLs/Self Care Home Management;Aquatic Therapy;Cryotherapy;Electrical Stimulation;DME Instruction;Ultrasound;Traction;Gait training;Stair training;Functional mobility training;Therapeutic activities;Therapeutic exercise;Balance training;Orthotic Fit/Training;Patient/family education;Neuromuscular re-education;Manual techniques;Scar mobilization;Passive range of motion;Taping;Energy conservation;Dry needling;Joint Manipulations    PT Next Visit Plan Manual therapy to Right ankle, backwards walking, mobilization belt. DF mobilization with foot elevated on step with strap pulling for posterior glide    PT Home Exercise Plan calf stretch with towel, standing gastroc/soleus stretch, DF mobilization, squat form, tibialis raises. 4/1: plate walks, beanie bag elevator, metatarsal mobilizations.    Consulted and Agree with Plan of Care Patient           Patient will benefit from skilled therapeutic intervention in order to improve the following deficits and impairments:  Abnormal gait,Decreased balance,Decreased mobility,Decreased range of motion,Decreased strength,Hypomobility,Difficulty walking,Impaired flexibility,Improper body mechanics,Pain  Visit Diagnosis: Stiffness of right ankle, not elsewhere classified  Difficulty in walking,  not elsewhere classified  Unsteadiness on feet     Problem List Patient Active Problem List   Diagnosis Date Noted  . S/P ORIF (open reduction internal fixation) fracture right ankle 01/06/18 01/09/2018  . Closed trimalleolar fracture of right ankle   . Hyperlipidemia 07/05/2017  . Vitamin D deficiency 07/05/2017  . Essential hypertension 06/17/2017  .  Post herpetic neuralgia 06/17/2017  . History of pulmonary embolism 06/17/2017   9:48 AM, 11/23/20 M. Shary Decamp, PT, DPT Physical Therapist-  Office Number: [phone]  [facility] [address] [place], [place], [address] Phone: [phone]   Fax:  [phone]  Name: Janice Gonzales MRN: [id number] Date of Birth: 1955-12-01

## 2020-11-28 ENCOUNTER — Other Ambulatory Visit: Payer: Self-pay

## 2020-11-28 ENCOUNTER — Ambulatory Visit (HOSPITAL_COMMUNITY): Payer: BC Managed Care – PPO

## 2020-11-28 DIAGNOSIS — M25671 Stiffness of right ankle, not elsewhere classified: Secondary | ICD-10-CM

## 2020-11-28 DIAGNOSIS — R262 Difficulty in walking, not elsewhere classified: Secondary | ICD-10-CM

## 2020-11-28 DIAGNOSIS — R2681 Unsteadiness on feet: Secondary | ICD-10-CM | POA: Diagnosis not present

## 2020-11-28 NOTE — Therapy (Signed)
[facility] [address] [place], [place], [address] Phone: [phone]   Fax:  [phone]  Physical Therapy Treatment  Patient Details  Name: Janice Gonzales MRN: [id number] Date of Birth: 1956/02/21 Referring Provider (PT): Dr. Toni Arthurs   Encounter Date: 11/28/2020   PT End of Session - 11/28/20 1046    Visit Number 7    Number of Visits 8    Date for PT Re-Evaluation 12/02/20    Authorization Type BCBS Comm PPO,    PT Start Time 1037    PT Stop Time 1135    PT Time Calculation (min) 58 min    Activity Tolerance Patient tolerated treatment well    Behavior During Therapy [facility] for tasks assessed/performed           Past Medical History:  Diagnosis Date  . Arthritis   . Glaucoma   . History of DVT of lower extremity    with PE  . Hyperlipidemia 07/05/2017  . Hypertension   . Post herpetic neuralgia 06/17/2017  . Sleep apnea     Past Surgical History:  Procedure Laterality Date  . ORIF ANKLE FRACTURE Right 01/06/2018   Procedure: OPEN REDUCTION INTERNAL FIXATION (ORIF) RIGHT ANKLE FRACTURE;  Surgeon: Vickki Hearing, MD;  Location: [facility];  Service: Orthopedics;  Laterality: Right;    There were no vitals filed for this visit.   Subjective Assessment - 11/28/20 1041    Subjective Pt reports no new issues but notes increased soreness after her last session    How long can you sit comfortably? 20-30 min notes right ankle stiffness and feeling locked    How long can you walk comfortably? 3-4 hours during shopping    Patient Stated Goals Be    Currently in Pain? Yes    Pain Score 3     Pain Location Ankle    Pain Orientation Right    Pain Descriptors / Indicators Aching;Sore              [facility] PT Assessment - 11/28/20 0001      Assessment   Medical Diagnosis Z47.89 Encounter for other orthopedic aftercare    Referring Provider (PT) Dr. Toni Arthurs                         [facility] Adult PT  Treatment/Exercise - 11/28/20 0001      Manual Therapy   Manual Therapy Joint mobilization    Manual therapy comments completed separate from all other interventions    Joint Mobilization traction and posterior glides right ankle to increase dorsiflexion      Ankle Exercises: Standing   Heel Raises Both   3x10 from edge of step   Toe Raise 2 seconds   3x10 tib raises   Braiding (Round Trip) resisted retro-walk 2x2 min      Ankle Exercises: Stretches   Gastroc Stretch 2 reps;60 seconds   edge of step   Slant Board Stretch 4 reps;60 seconds    Other Stretch tall kneeling DF stretch (knee drive to wall x 2 min                    PT Short Term Goals - 11/28/20 1139      PT SHORT TERM GOAL #1   Title Pt will have improved R ankle AROM by 5 deg throughout in order to maximize gait.    Baseline 11 degrees dorsiflexion  Time 2    Period Weeks    Status On-going    Target Date 11/18/20      PT SHORT TERM GOAL #2   Title Pt will have 1/2 grade improvement throughout ankle MMT in order to maximize gait and balance.    Baseline 3+/5 right ankle    Time 2    Period Weeks    Status On-going    Target Date 11/18/20      PT SHORT TERM GOAL #3   Title Patient will demo proper squat form 5/5 observations without cues to improve body mechanics    Baseline poor control and mechanics    Time 2    Period Weeks    Status On-going    Target Date 11/18/20             PT Long Term Goals - 11/28/20 1139      PT LONG TERM GOAL #1   Title Demo improved right ankle dorsiflexion to 18 degrees to normalize gait mechanics    Baseline 11    Time 4    Period Weeks    Status On-going      PT LONG TERM GOAL #2   Title Demo improved ambulation velocity and tolerance as evidenced by 400 ft during    Baseline 300    Time 4    Period Weeks    Status On-going                 Plan - 11/28/20 1138    Clinical Impression Statement Tolerating tx sessions well and  demonstrate simproved right ankle dorsiflexion as evidenced by kneeling DF stretch/mobilization with appreicable tibial advancement observed vs previous sessions where tibia remained nearly vertical. Progress for last session at next encounter    Personal Factors and Comorbidities Time since onset of injury/illness/exacerbation    Examination-Activity Limitations Bend;Stairs;Squat;Locomotion Level    Examination-Participation Restrictions Occupation;Yard Work    Stability/Clinical Decision Making Stable/Uncomplicated    Rehab Potential Good    PT Frequency 2x / week    PT Duration 4 weeks    PT Treatment/Interventions ADLs/Self Care Home Management;Aquatic Therapy;Cryotherapy;Electrical Stimulation;DME Instruction;Ultrasound;Traction;Gait training;Stair training;Functional mobility training;Therapeutic activities;Therapeutic exercise;Balance training;Orthotic Fit/Training;Patient/family education;Neuromuscular re-education;Manual techniques;Scar mobilization;Passive range of motion;Taping;Energy conservation;Dry needling;Joint Manipulations    PT Next Visit Plan Manual therapy to Right ankle, backwards walking, mobilization belt. DF mobilization with foot elevated on step with strap pulling for posterior glide    PT Home Exercise Plan calf stretch with towel, standing gastroc/soleus stretch, DF mobilization, squat form, tibialis raises. 4/1: plate walks, beanie bag elevator, metatarsal mobilizations.    Consulted and Agree with Plan of Care Patient           Patient will benefit from skilled therapeutic intervention in order to improve the following deficits and impairments:  Abnormal gait,Decreased balance,Decreased mobility,Decreased range of motion,Decreased strength,Hypomobility,Difficulty walking,Impaired flexibility,Improper body mechanics,Pain  Visit Diagnosis: Stiffness of right ankle, not elsewhere classified  Difficulty in walking, not elsewhere classified  Unsteadiness on  feet     Problem List Patient Active Problem List   Diagnosis Date Noted  . S/P ORIF (open reduction internal fixation) fracture right ankle 01/06/18 01/09/2018  . Closed trimalleolar fracture of right ankle   . Hyperlipidemia 07/05/2017  . Vitamin D deficiency 07/05/2017  . Essential hypertension 06/17/2017  . Post herpetic neuralgia 06/17/2017  . History of pulmonary embolism 06/17/2017   11:48 AM, 11/28/20 M. Shary Decamp, PT, DPT Physical Therapist- [place] Office Number: [phone]  [facility] [address] [place], [place], [address] Phone: [phone]   Fax:  [phone]  Name: Janice Gonzales MRN: [id number] Date of Birth: 1955/09/09

## 2020-12-01 DIAGNOSIS — G4733 Obstructive sleep apnea (adult) (pediatric): Secondary | ICD-10-CM | POA: Diagnosis not present

## 2020-12-02 ENCOUNTER — Other Ambulatory Visit: Payer: Self-pay

## 2020-12-02 ENCOUNTER — Ambulatory Visit (HOSPITAL_COMMUNITY): Payer: BC Managed Care – PPO

## 2020-12-02 DIAGNOSIS — R262 Difficulty in walking, not elsewhere classified: Secondary | ICD-10-CM | POA: Diagnosis not present

## 2020-12-02 DIAGNOSIS — R2681 Unsteadiness on feet: Secondary | ICD-10-CM

## 2020-12-02 DIAGNOSIS — M25671 Stiffness of right ankle, not elsewhere classified: Secondary | ICD-10-CM | POA: Diagnosis not present

## 2020-12-02 NOTE — Therapy (Signed)
[facility] [address] [place], [place], [address] Phone: [phone]   Fax:  [phone]  Physical Therapy Treatment and D/C Summary  Patient Details  Name: Janice Gonzales MRN: [id number] Date of Birth: [birth year] Referring Provider (PT): Dr. Wylene Simmer  PHYSICAL THERAPY DISCHARGE SUMMARY  Visits from Start of Care: 8  Current functional level related to goals / functional outcomes: Demonstrates improved ankle ROM and strength and met 3/3 STG and 2/2 LTG   Remaining deficits: Continues to exhibit right ankle ROM restrictions and biomechanical faults/compensations but good self-monitoring for correction   Education / Equipment: HEP  Plan: Patient agrees to discharge.  Patient goals were met. Patient is being discharged due to meeting the stated rehab goals.  ?????       Encounter Date: 12/02/2020   PT End of Session - 12/02/20 1651    Visit Number 8    Number of Visits 8    Date for PT Re-Evaluation 12/02/20    Authorization Type [employer] PPO,    PT Start Time 1646    PT Stop Time 1730    PT Time Calculation (min) 44 min    Activity Tolerance Patient tolerated treatment well    Behavior During Therapy WFL for tasks assessed/performed           Past Medical History:  Diagnosis Date  . Arthritis   . Glaucoma   . History of DVT of lower extremity    with PE  . Hyperlipidemia 07/05/2017  . Hypertension   . Post herpetic neuralgia 06/17/2017  . Sleep apnea     Past Surgical History:  Procedure Laterality Date  . ORIF ANKLE FRACTURE Right 01/06/2018   Procedure: OPEN REDUCTION INTERNAL FIXATION (ORIF) RIGHT ANKLE FRACTURE;  Surgeon: Carole Civil, MD;  Location: [facility];  Service: Orthopedics;  Laterality: Right;    There were no vitals filed for this visit.   Subjective Assessment - 12/02/20 1653    Subjective Pt reports continued soreness/stiffness after prolonged immobility (rest or first awakening)     How long can you sit comfortably? 20-30 min notes right ankle stiffness and feeling locked    How long can you walk comfortably? 3-4 hours during shopping    Patient Stated Goals Be    Currently in Pain? Yes    Pain Score 3    general pain rating throughout the day   Pain Location Ankle    Pain Orientation Right    Pain Descriptors / Indicators Aching;Sore              [facility] PT Assessment - 12/02/20 0001      Assessment   Medical Diagnosis Z47.89 Encounter for other orthopedic aftercare    Referring Provider (PT) Dr. Wylene Simmer      AROM   Right Ankle Dorsiflexion 20    Right Ankle Plantar Flexion 30      Strength   Right Ankle Dorsiflexion 5/5    Right Ankle Plantar Flexion 5/5                         [facility] Adult PT Treatment/Exercise - 12/02/20 0001      Ambulation/Gait   Ambulation/Gait Yes    Ambulation/Gait Assistance 7: Independent    Ambulation Distance (Feet) 440 Feet    Assistive device None    Gait Pattern Within Functional Limits    Ambulation Surface Level;Indoor    Pre-Gait Activities backwards  walking      Ankle Exercises: Stretches   Gastroc Stretch 2 reps;60 seconds    Slant Board Stretch 4 reps;60 seconds    Other Stretch tall kneeling DF stretch (knee drive to wall x 2 min                    PT Short Term Goals - 12/02/20 1657      PT SHORT TERM GOAL #1   Title Pt will have improved R ankle AROM by 5 deg throughout in order to maximize gait.    Baseline 20 degrees dorsiflexion    Time 2    Period Weeks    Status Achieved    Target Date 11/18/20      PT SHORT TERM GOAL #2   Title Pt will have 1/2 grade improvement throughout ankle MMT in order to maximize gait and balance.    Baseline 5/5 right ankle    Time 2    Period Weeks    Status Achieved    Target Date 11/18/20      PT SHORT TERM GOAL #3   Title Patient will demo proper squat form 5/5 observations without cues to improve body mechanics    Baseline  improved control, still compensates towards end-range    Time 2    Period Weeks    Status Achieved    Target Date 11/18/20             PT Long Term Goals - 12/02/20 1654      PT LONG TERM GOAL #1   Title Demo improved right ankle dorsiflexion to 18 degrees to normalize gait mechanics    Baseline 20 degrees in NWB and 10 degrees in WBing (kneeling position)    Time 4    Period Weeks    Status Achieved      PT LONG TERM GOAL #2   Title Demo improved ambulation velocity and tolerance as evidenced by 400 ft during 2MWT    Baseline 440 ft    Time 4    Period Weeks    Status Achieved                 Plan - 12/02/20 1743    Clinical Impression Statement Progressed well with POC details and able to achieve STG/LTG. Pt educated on timeline for healing and importance of daily HEP performance and mobilization activities to increase ROM and overall biomechanics awareness to improve functional activity tolerance and methods of self-monitoring and progressing of activities to progressively increase ROM/strength    Personal Factors and Comorbidities Time since onset of injury/illness/exacerbation    Examination-Activity Limitations Bend;Stairs;Squat;Locomotion Level    Examination-Participation Restrictions Occupation;Yard Work    Stability/Clinical Decision Making Stable/Uncomplicated    Rehab Potential Good    PT Frequency 2x / week    PT Duration 4 weeks    PT Treatment/Interventions ADLs/Self Care Home Management;Aquatic Therapy;Cryotherapy;Electrical Stimulation;DME Instruction;Ultrasound;Traction;Gait training;Stair training;Functional mobility training;Therapeutic activities;Therapeutic exercise;Balance training;Orthotic Fit/Training;Patient/family education;Neuromuscular re-education;Manual techniques;Scar mobilization;Passive range of motion;Taping;Energy conservation;Dry needling;Joint Manipulations    PT Next Visit Plan D/C to HEP    PT Home Exercise Plan calf stretch with  towel, standing gastroc/soleus stretch, DF mobilization, squat form, tibialis raises. 4/1: plate walks, beanie bag elevator, metatarsal mobilizations.    Consulted and Agree with Plan of Care Patient           Patient will benefit from skilled therapeutic intervention in order to improve the following deficits and impairments:  Abnormal gait,Decreased balance,Decreased  mobility,Decreased range of motion,Decreased strength,Hypomobility,Difficulty walking,Impaired flexibility,Improper body mechanics,Pain  Visit Diagnosis: Stiffness of right ankle, not elsewhere classified  Difficulty in walking, not elsewhere classified  Unsteadiness on feet     Problem List Patient Active Problem List   Diagnosis Date Noted  . S/P ORIF (open reduction internal fixation) fracture right ankle 01/06/18 01/09/2018  . Closed trimalleolar fracture of right ankle   . Hyperlipidemia 07/05/2017  . Vitamin D deficiency 07/05/2017  . Essential hypertension 06/17/2017  . Post herpetic neuralgia 06/17/2017  . History of pulmonary embolism 06/17/2017   5:46 PM, 12/02/20 M. Sherlyn Lees, PT, DPT Physical Therapist- [place] Office Number: [phone]  [facility] [address] [place], [place], [address] Phone: [phone]   Fax:  [phone]  Name: JANYIAH SILVERI MRN: [id number] Date of Birth: 1955/11/13

## 2020-12-12 ENCOUNTER — Other Ambulatory Visit (HOSPITAL_COMMUNITY): Payer: Self-pay | Admitting: Obstetrics & Gynecology

## 2020-12-12 DIAGNOSIS — Z1231 Encounter for screening mammogram for malignant neoplasm of breast: Secondary | ICD-10-CM

## 2020-12-31 DIAGNOSIS — G4733 Obstructive sleep apnea (adult) (pediatric): Secondary | ICD-10-CM | POA: Diagnosis not present

## 2021-01-31 DIAGNOSIS — G4733 Obstructive sleep apnea (adult) (pediatric): Secondary | ICD-10-CM | POA: Diagnosis not present

## 2021-02-02 DIAGNOSIS — L570 Actinic keratosis: Secondary | ICD-10-CM | POA: Diagnosis not present

## 2021-02-10 DIAGNOSIS — Z6832 Body mass index (BMI) 32.0-32.9, adult: Secondary | ICD-10-CM | POA: Diagnosis not present

## 2021-02-10 DIAGNOSIS — Z01419 Encounter for gynecological examination (general) (routine) without abnormal findings: Secondary | ICD-10-CM | POA: Diagnosis not present

## 2021-03-02 DIAGNOSIS — G4733 Obstructive sleep apnea (adult) (pediatric): Secondary | ICD-10-CM | POA: Diagnosis not present

## 2021-04-02 DIAGNOSIS — G4733 Obstructive sleep apnea (adult) (pediatric): Secondary | ICD-10-CM | POA: Diagnosis not present

## 2021-04-06 ENCOUNTER — Other Ambulatory Visit: Payer: Self-pay

## 2021-04-06 ENCOUNTER — Ambulatory Visit (HOSPITAL_COMMUNITY)
Admission: RE | Admit: 2021-04-06 | Discharge: 2021-04-06 | Disposition: A | Discharge status: Home / Self Care | Payer: BC Managed Care – PPO | Source: Ambulatory Visit | Attending: Obstetrics & Gynecology | Admitting: Obstetrics & Gynecology

## 2021-04-06 DIAGNOSIS — Z1231 Encounter for screening mammogram for malignant neoplasm of breast: Secondary | ICD-10-CM | POA: Diagnosis not present

## 2021-04-07 DIAGNOSIS — H35373 Puckering of macula, bilateral: Secondary | ICD-10-CM | POA: Diagnosis not present

## 2021-04-07 DIAGNOSIS — H35362 Drusen (degenerative) of macula, left eye: Secondary | ICD-10-CM | POA: Diagnosis not present

## 2021-04-07 DIAGNOSIS — H43813 Vitreous degeneration, bilateral: Secondary | ICD-10-CM | POA: Diagnosis not present

## 2021-04-07 DIAGNOSIS — H35033 Hypertensive retinopathy, bilateral: Secondary | ICD-10-CM | POA: Diagnosis not present

## 2022-02-26 IMAGING — MG DIGITAL SCREENING BILAT W/ TOMO W/ CAD
8 series · 8 of 24 positions shown · non-contrast
Comparison: Previous exam(s).

CLINICAL DATA: Screening.

EXAM:
DIGITAL SCREENING BILATERAL MAMMOGRAM WITH TOMO AND CAD

[R CC synth-2D]
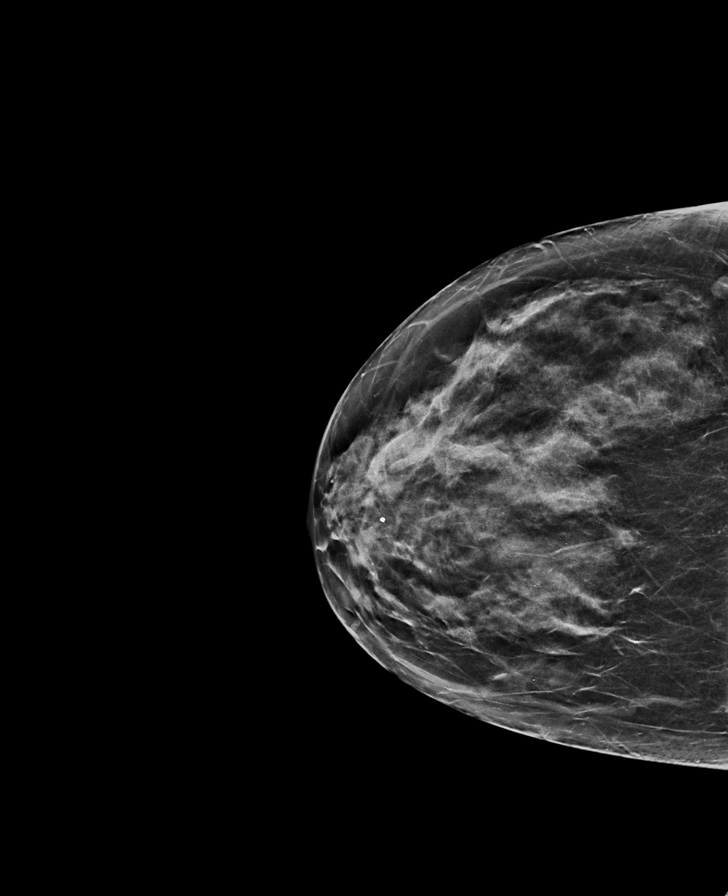

[L MLO synth-2D]
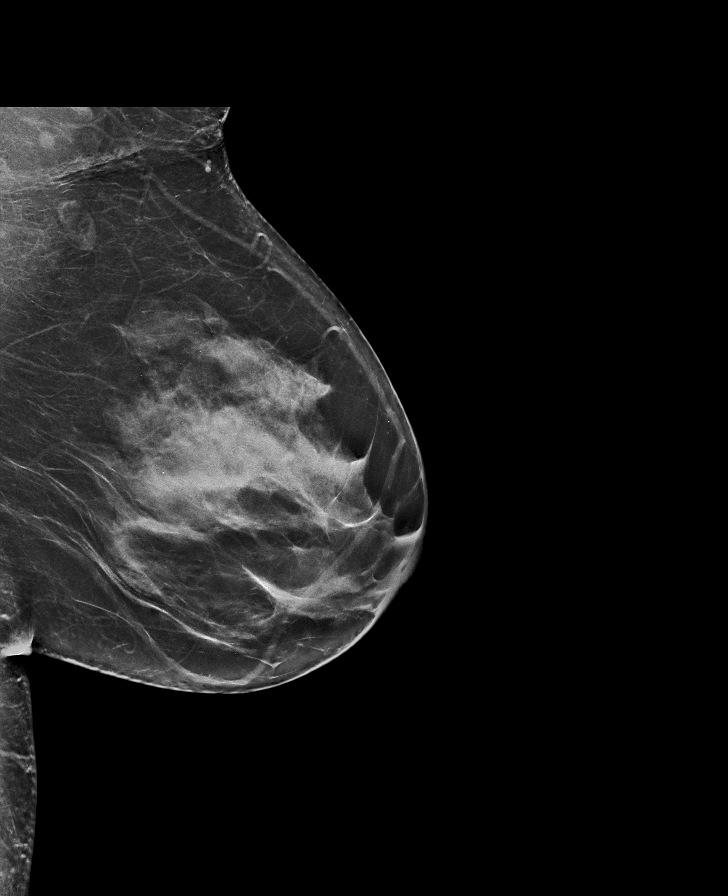

[L CC synth-2D]
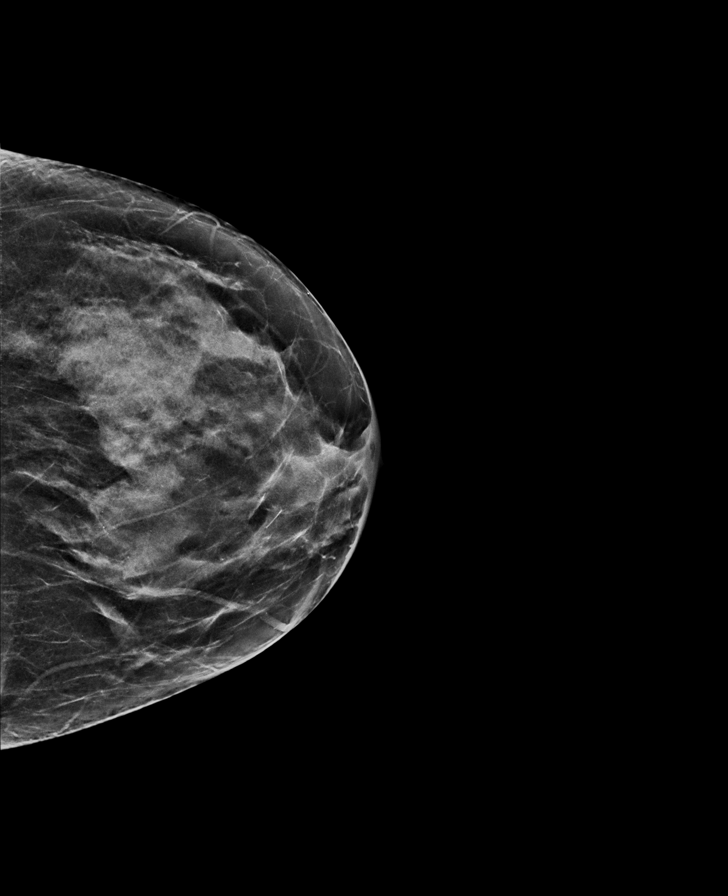

[R MLO synth-2D]
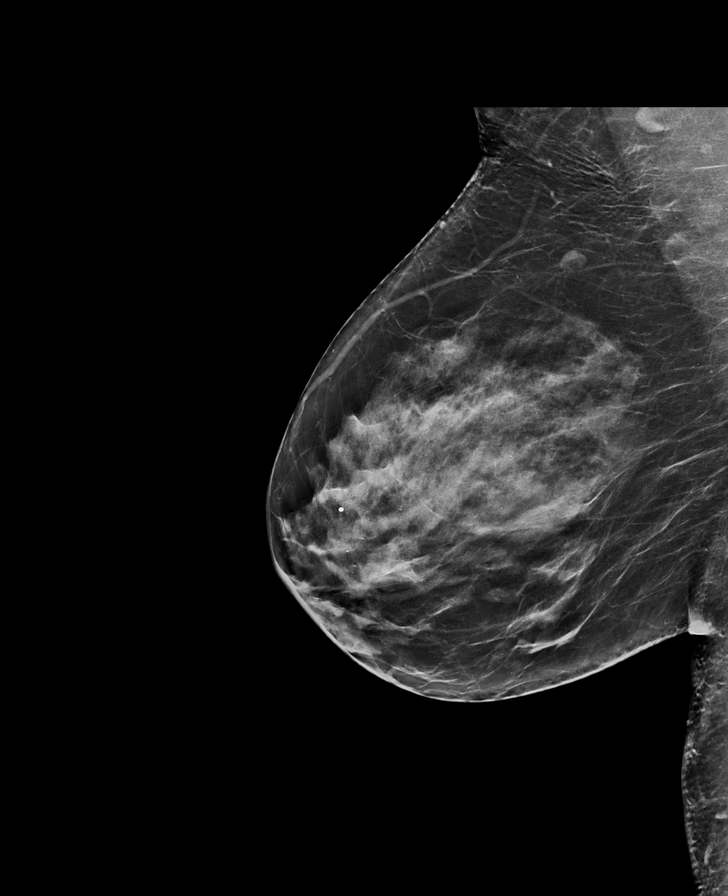

[L CC tomo · tomo slice 29/57.0]
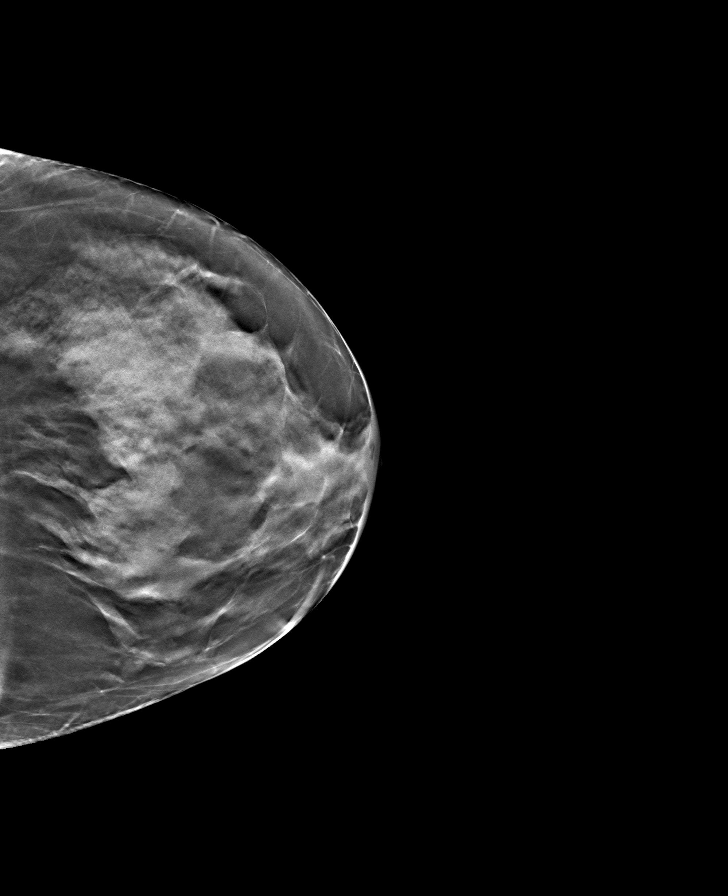

[L MLO tomo · tomo slice 35/69.0]
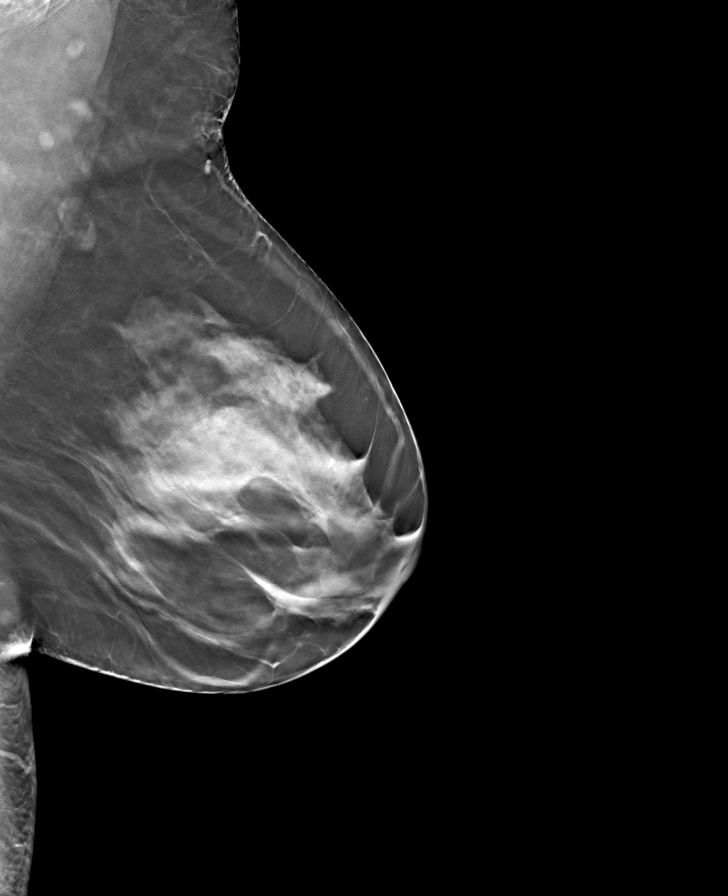

[R MLO tomo · tomo slice 35/68.0]
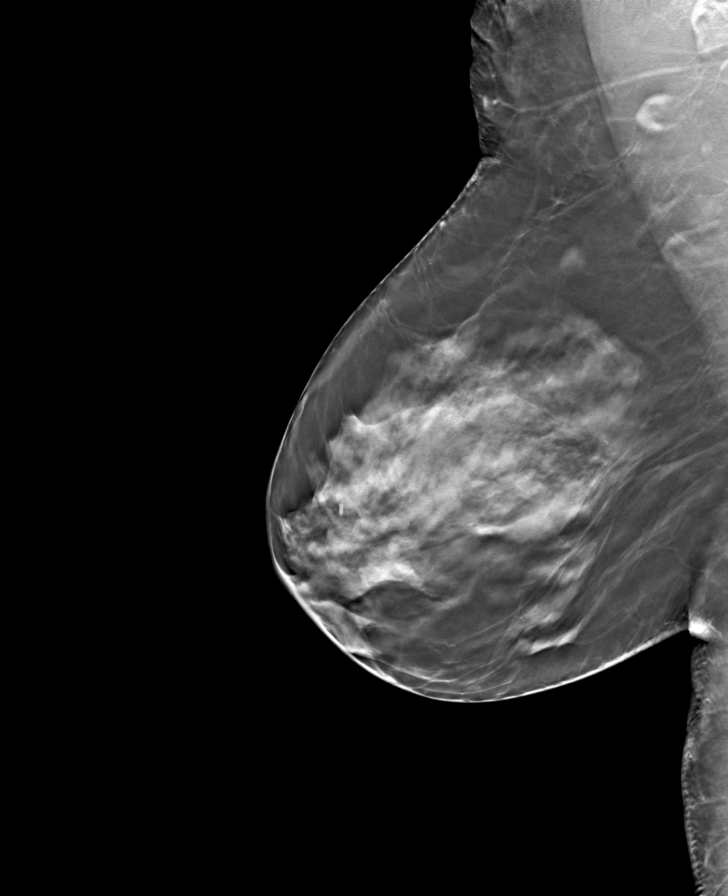

[R CC tomo · tomo slice 30/59.0]
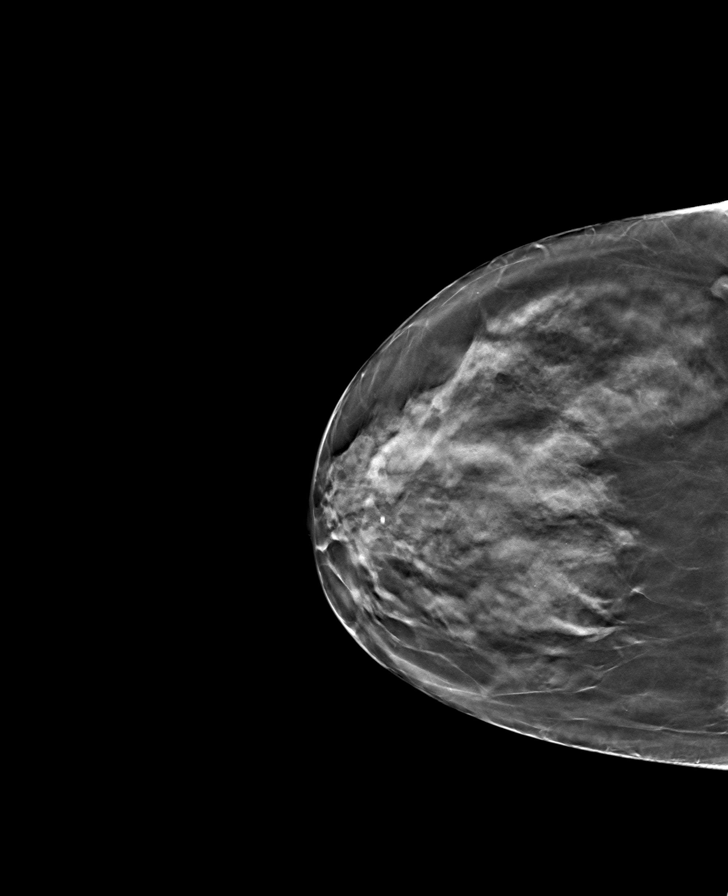

[8 of 24 positions shown; findings below may reference images not displayed]

ACR Breast Density Category d: The breast tissue is extremely dense,
which lowers the sensitivity of mammography
FINDINGS: There are no findings suspicious for malignancy. Images were
processed with CAD.
IMPRESSION: No mammographic evidence of malignancy. A result letter of this
screening mammogram will be mailed directly to the patient.

RECOMMENDATION:
Screening mammogram in one year. (Code:[id number]-[id number])

BI-RADS CATEGORY  1: Negative.

## 2022-03-27 DIAGNOSIS — H401131 Primary open-angle glaucoma, bilateral, mild stage: Secondary | ICD-10-CM | POA: Diagnosis not present

## 2022-04-12 DIAGNOSIS — H401131 Primary open-angle glaucoma, bilateral, mild stage: Secondary | ICD-10-CM | POA: Diagnosis not present

## 2022-05-17 DIAGNOSIS — J069 Acute upper respiratory infection, unspecified: Secondary | ICD-10-CM | POA: Diagnosis not present

## 2022-07-10 DIAGNOSIS — J302 Other seasonal allergic rhinitis: Secondary | ICD-10-CM | POA: Diagnosis not present

## 2022-07-10 DIAGNOSIS — I10 Essential (primary) hypertension: Secondary | ICD-10-CM | POA: Diagnosis not present

## 2022-07-18 DIAGNOSIS — I10 Essential (primary) hypertension: Secondary | ICD-10-CM | POA: Diagnosis not present

## 2022-07-18 DIAGNOSIS — R7303 Prediabetes: Secondary | ICD-10-CM | POA: Diagnosis not present

## 2022-07-18 DIAGNOSIS — E559 Vitamin D deficiency, unspecified: Secondary | ICD-10-CM | POA: Diagnosis not present

## 2022-07-19 DIAGNOSIS — H401131 Primary open-angle glaucoma, bilateral, mild stage: Secondary | ICD-10-CM | POA: Diagnosis not present

## 2022-07-19 DIAGNOSIS — H01002 Unspecified blepharitis right lower eyelid: Secondary | ICD-10-CM | POA: Diagnosis not present

## 2022-07-19 DIAGNOSIS — H01004 Unspecified blepharitis left upper eyelid: Secondary | ICD-10-CM | POA: Diagnosis not present

## 2022-07-19 DIAGNOSIS — H01001 Unspecified blepharitis right upper eyelid: Secondary | ICD-10-CM | POA: Diagnosis not present

## 2022-07-20 DIAGNOSIS — J302 Other seasonal allergic rhinitis: Secondary | ICD-10-CM | POA: Diagnosis not present

## 2022-07-20 DIAGNOSIS — I10 Essential (primary) hypertension: Secondary | ICD-10-CM | POA: Diagnosis not present

## 2022-07-20 DIAGNOSIS — R7303 Prediabetes: Secondary | ICD-10-CM | POA: Diagnosis not present

## 2022-07-20 DIAGNOSIS — H409 Unspecified glaucoma: Secondary | ICD-10-CM | POA: Diagnosis not present

## 2022-07-20 DIAGNOSIS — G4730 Sleep apnea, unspecified: Secondary | ICD-10-CM | POA: Diagnosis not present

## 2022-07-20 DIAGNOSIS — E559 Vitamin D deficiency, unspecified: Secondary | ICD-10-CM | POA: Diagnosis not present

## 2022-09-13 DIAGNOSIS — H401121 Primary open-angle glaucoma, left eye, mild stage: Secondary | ICD-10-CM | POA: Diagnosis not present

## 2022-09-24 DIAGNOSIS — M79671 Pain in right foot: Secondary | ICD-10-CM | POA: Diagnosis not present

## 2022-09-24 DIAGNOSIS — M19171 Post-traumatic osteoarthritis, right ankle and foot: Secondary | ICD-10-CM | POA: Diagnosis not present

## 2022-09-24 DIAGNOSIS — M722 Plantar fascial fibromatosis: Secondary | ICD-10-CM | POA: Diagnosis not present

## 2022-09-24 DIAGNOSIS — M25571 Pain in right ankle and joints of right foot: Secondary | ICD-10-CM | POA: Diagnosis not present

## 2022-10-04 DIAGNOSIS — M722 Plantar fascial fibromatosis: Secondary | ICD-10-CM | POA: Diagnosis not present

## 2022-10-04 DIAGNOSIS — H401131 Primary open-angle glaucoma, bilateral, mild stage: Secondary | ICD-10-CM | POA: Diagnosis not present

## 2022-10-08 DIAGNOSIS — M722 Plantar fascial fibromatosis: Secondary | ICD-10-CM | POA: Diagnosis not present

## 2022-10-10 DIAGNOSIS — M722 Plantar fascial fibromatosis: Secondary | ICD-10-CM | POA: Diagnosis not present

## 2022-10-12 DIAGNOSIS — M722 Plantar fascial fibromatosis: Secondary | ICD-10-CM | POA: Diagnosis not present

## 2022-10-15 DIAGNOSIS — M722 Plantar fascial fibromatosis: Secondary | ICD-10-CM | POA: Diagnosis not present

## 2022-10-17 DIAGNOSIS — M722 Plantar fascial fibromatosis: Secondary | ICD-10-CM | POA: Diagnosis not present

## 2022-10-19 DIAGNOSIS — M722 Plantar fascial fibromatosis: Secondary | ICD-10-CM | POA: Diagnosis not present

## 2022-10-22 DIAGNOSIS — M722 Plantar fascial fibromatosis: Secondary | ICD-10-CM | POA: Diagnosis not present

## 2022-10-23 DIAGNOSIS — I10 Essential (primary) hypertension: Secondary | ICD-10-CM | POA: Diagnosis not present

## 2022-10-23 DIAGNOSIS — R7303 Prediabetes: Secondary | ICD-10-CM | POA: Diagnosis not present

## 2022-10-24 DIAGNOSIS — M722 Plantar fascial fibromatosis: Secondary | ICD-10-CM | POA: Diagnosis not present

## 2022-10-26 DIAGNOSIS — M722 Plantar fascial fibromatosis: Secondary | ICD-10-CM | POA: Diagnosis not present

## 2022-10-29 DIAGNOSIS — R7303 Prediabetes: Secondary | ICD-10-CM | POA: Diagnosis not present

## 2022-10-29 DIAGNOSIS — H409 Unspecified glaucoma: Secondary | ICD-10-CM | POA: Diagnosis not present

## 2022-10-29 DIAGNOSIS — E559 Vitamin D deficiency, unspecified: Secondary | ICD-10-CM | POA: Diagnosis not present

## 2022-10-29 DIAGNOSIS — Z7182 Exercise counseling: Secondary | ICD-10-CM | POA: Diagnosis not present

## 2022-10-29 DIAGNOSIS — I10 Essential (primary) hypertension: Secondary | ICD-10-CM | POA: Diagnosis not present

## 2022-10-29 DIAGNOSIS — Z6833 Body mass index (BMI) 33.0-33.9, adult: Secondary | ICD-10-CM | POA: Diagnosis not present

## 2022-10-29 DIAGNOSIS — E669 Obesity, unspecified: Secondary | ICD-10-CM | POA: Diagnosis not present

## 2022-10-29 DIAGNOSIS — Z713 Dietary counseling and surveillance: Secondary | ICD-10-CM | POA: Diagnosis not present

## 2022-10-29 DIAGNOSIS — E782 Mixed hyperlipidemia: Secondary | ICD-10-CM | POA: Diagnosis not present

## 2022-10-29 DIAGNOSIS — J302 Other seasonal allergic rhinitis: Secondary | ICD-10-CM | POA: Diagnosis not present

## 2022-10-29 DIAGNOSIS — G4730 Sleep apnea, unspecified: Secondary | ICD-10-CM | POA: Diagnosis not present

## 2022-10-30 DIAGNOSIS — M722 Plantar fascial fibromatosis: Secondary | ICD-10-CM | POA: Diagnosis not present

## 2022-11-02 DIAGNOSIS — M722 Plantar fascial fibromatosis: Secondary | ICD-10-CM | POA: Diagnosis not present

## 2022-11-05 DIAGNOSIS — H01002 Unspecified blepharitis right lower eyelid: Secondary | ICD-10-CM | POA: Diagnosis not present

## 2022-11-05 DIAGNOSIS — M722 Plantar fascial fibromatosis: Secondary | ICD-10-CM | POA: Diagnosis not present

## 2022-11-05 DIAGNOSIS — M19171 Post-traumatic osteoarthritis, right ankle and foot: Secondary | ICD-10-CM | POA: Diagnosis not present

## 2022-11-05 DIAGNOSIS — H01004 Unspecified blepharitis left upper eyelid: Secondary | ICD-10-CM | POA: Diagnosis not present

## 2022-11-05 DIAGNOSIS — H401131 Primary open-angle glaucoma, bilateral, mild stage: Secondary | ICD-10-CM | POA: Diagnosis not present

## 2022-11-05 DIAGNOSIS — H01001 Unspecified blepharitis right upper eyelid: Secondary | ICD-10-CM | POA: Diagnosis not present

## 2022-11-26 ENCOUNTER — Other Ambulatory Visit (HOSPITAL_COMMUNITY): Payer: Self-pay | Admitting: Internal Medicine

## 2022-11-26 DIAGNOSIS — Z1231 Encounter for screening mammogram for malignant neoplasm of breast: Secondary | ICD-10-CM

## 2022-12-10 ENCOUNTER — Ambulatory Visit (HOSPITAL_COMMUNITY)
Admission: RE | Admit: 2022-12-10 | Discharge: 2022-12-10 | Disposition: A | Discharge status: Home / Self Care | Payer: Medicare HMO | Source: Ambulatory Visit | Attending: Internal Medicine | Admitting: Internal Medicine

## 2022-12-10 DIAGNOSIS — Z1231 Encounter for screening mammogram for malignant neoplasm of breast: Secondary | ICD-10-CM | POA: Insufficient documentation

## 2022-12-21 DIAGNOSIS — L255 Unspecified contact dermatitis due to plants, except food: Secondary | ICD-10-CM | POA: Diagnosis not present

## 2022-12-21 DIAGNOSIS — J302 Other seasonal allergic rhinitis: Secondary | ICD-10-CM | POA: Diagnosis not present

## 2022-12-31 DIAGNOSIS — L570 Actinic keratosis: Secondary | ICD-10-CM | POA: Diagnosis not present

## 2022-12-31 DIAGNOSIS — L309 Dermatitis, unspecified: Secondary | ICD-10-CM | POA: Diagnosis not present

## 2022-12-31 DIAGNOSIS — Z1283 Encounter for screening for malignant neoplasm of skin: Secondary | ICD-10-CM | POA: Diagnosis not present

## 2022-12-31 DIAGNOSIS — D485 Neoplasm of uncertain behavior of skin: Secondary | ICD-10-CM | POA: Diagnosis not present

## 2023-01-07 DIAGNOSIS — H11823 Conjunctivochalasis, bilateral: Secondary | ICD-10-CM | POA: Diagnosis not present

## 2023-01-07 DIAGNOSIS — H04123 Dry eye syndrome of bilateral lacrimal glands: Secondary | ICD-10-CM | POA: Diagnosis not present

## 2023-01-07 DIAGNOSIS — H401131 Primary open-angle glaucoma, bilateral, mild stage: Secondary | ICD-10-CM | POA: Diagnosis not present

## 2023-01-07 DIAGNOSIS — H01004 Unspecified blepharitis left upper eyelid: Secondary | ICD-10-CM | POA: Diagnosis not present

## 2023-01-07 DIAGNOSIS — H01001 Unspecified blepharitis right upper eyelid: Secondary | ICD-10-CM | POA: Diagnosis not present

## 2023-01-07 DIAGNOSIS — H11153 Pinguecula, bilateral: Secondary | ICD-10-CM | POA: Diagnosis not present

## 2023-01-07 DIAGNOSIS — H01002 Unspecified blepharitis right lower eyelid: Secondary | ICD-10-CM | POA: Diagnosis not present

## 2023-01-07 DIAGNOSIS — H01005 Unspecified blepharitis left lower eyelid: Secondary | ICD-10-CM | POA: Diagnosis not present

## 2023-02-04 DIAGNOSIS — H01001 Unspecified blepharitis right upper eyelid: Secondary | ICD-10-CM | POA: Diagnosis not present

## 2023-02-04 DIAGNOSIS — H401131 Primary open-angle glaucoma, bilateral, mild stage: Secondary | ICD-10-CM | POA: Diagnosis not present

## 2023-02-04 DIAGNOSIS — H01002 Unspecified blepharitis right lower eyelid: Secondary | ICD-10-CM | POA: Diagnosis not present

## 2023-02-04 DIAGNOSIS — H01004 Unspecified blepharitis left upper eyelid: Secondary | ICD-10-CM | POA: Diagnosis not present

## 2023-02-08 DIAGNOSIS — E782 Mixed hyperlipidemia: Secondary | ICD-10-CM | POA: Diagnosis not present

## 2023-02-08 DIAGNOSIS — I10 Essential (primary) hypertension: Secondary | ICD-10-CM | POA: Diagnosis not present

## 2023-02-08 DIAGNOSIS — R7303 Prediabetes: Secondary | ICD-10-CM | POA: Diagnosis not present

## 2023-02-08 DIAGNOSIS — E559 Vitamin D deficiency, unspecified: Secondary | ICD-10-CM | POA: Diagnosis not present

## 2023-02-26 ENCOUNTER — Other Ambulatory Visit (HOSPITAL_COMMUNITY): Payer: Self-pay | Admitting: Family Medicine

## 2023-02-26 DIAGNOSIS — E782 Mixed hyperlipidemia: Secondary | ICD-10-CM | POA: Diagnosis not present

## 2023-02-26 DIAGNOSIS — H409 Unspecified glaucoma: Secondary | ICD-10-CM | POA: Diagnosis not present

## 2023-02-26 DIAGNOSIS — Z1211 Encounter for screening for malignant neoplasm of colon: Secondary | ICD-10-CM | POA: Diagnosis not present

## 2023-02-26 DIAGNOSIS — I10 Essential (primary) hypertension: Secondary | ICD-10-CM | POA: Diagnosis not present

## 2023-02-26 DIAGNOSIS — E669 Obesity, unspecified: Secondary | ICD-10-CM | POA: Diagnosis not present

## 2023-02-26 DIAGNOSIS — M25562 Pain in left knee: Secondary | ICD-10-CM | POA: Diagnosis not present

## 2023-02-26 DIAGNOSIS — E559 Vitamin D deficiency, unspecified: Secondary | ICD-10-CM | POA: Diagnosis not present

## 2023-02-26 DIAGNOSIS — R7303 Prediabetes: Secondary | ICD-10-CM | POA: Diagnosis not present

## 2023-02-26 DIAGNOSIS — J302 Other seasonal allergic rhinitis: Secondary | ICD-10-CM | POA: Diagnosis not present

## 2023-02-26 DIAGNOSIS — G4730 Sleep apnea, unspecified: Secondary | ICD-10-CM | POA: Diagnosis not present

## 2023-02-26 DIAGNOSIS — Z713 Dietary counseling and surveillance: Secondary | ICD-10-CM | POA: Diagnosis not present

## 2023-03-05 ENCOUNTER — Encounter: Payer: Self-pay | Admitting: *Deleted

## 2023-03-18 DIAGNOSIS — H01004 Unspecified blepharitis left upper eyelid: Secondary | ICD-10-CM | POA: Diagnosis not present

## 2023-03-18 DIAGNOSIS — H01005 Unspecified blepharitis left lower eyelid: Secondary | ICD-10-CM | POA: Diagnosis not present

## 2023-03-18 DIAGNOSIS — H01002 Unspecified blepharitis right lower eyelid: Secondary | ICD-10-CM | POA: Diagnosis not present

## 2023-03-18 DIAGNOSIS — H01001 Unspecified blepharitis right upper eyelid: Secondary | ICD-10-CM | POA: Diagnosis not present

## 2023-03-18 DIAGNOSIS — H401131 Primary open-angle glaucoma, bilateral, mild stage: Secondary | ICD-10-CM | POA: Diagnosis not present

## 2023-03-18 DIAGNOSIS — H40053 Ocular hypertension, bilateral: Secondary | ICD-10-CM | POA: Diagnosis not present

## 2023-03-22 DIAGNOSIS — J302 Other seasonal allergic rhinitis: Secondary | ICD-10-CM | POA: Diagnosis not present

## 2023-03-22 DIAGNOSIS — L309 Dermatitis, unspecified: Secondary | ICD-10-CM | POA: Diagnosis not present

## 2023-03-22 DIAGNOSIS — G4730 Sleep apnea, unspecified: Secondary | ICD-10-CM | POA: Diagnosis not present

## 2023-03-22 DIAGNOSIS — Z809 Family history of malignant neoplasm, unspecified: Secondary | ICD-10-CM | POA: Diagnosis not present

## 2023-03-22 DIAGNOSIS — Z008 Encounter for other general examination: Secondary | ICD-10-CM | POA: Diagnosis not present

## 2023-03-22 DIAGNOSIS — R32 Unspecified urinary incontinence: Secondary | ICD-10-CM | POA: Diagnosis not present

## 2023-03-22 DIAGNOSIS — E669 Obesity, unspecified: Secondary | ICD-10-CM | POA: Diagnosis not present

## 2023-03-22 DIAGNOSIS — Z6832 Body mass index (BMI) 32.0-32.9, adult: Secondary | ICD-10-CM | POA: Diagnosis not present

## 2023-03-22 DIAGNOSIS — Z85828 Personal history of other malignant neoplasm of skin: Secondary | ICD-10-CM | POA: Diagnosis not present

## 2023-03-22 DIAGNOSIS — Z823 Family history of stroke: Secondary | ICD-10-CM | POA: Diagnosis not present

## 2023-03-22 DIAGNOSIS — Z8249 Family history of ischemic heart disease and other diseases of the circulatory system: Secondary | ICD-10-CM | POA: Diagnosis not present

## 2023-03-22 DIAGNOSIS — M1990 Unspecified osteoarthritis, unspecified site: Secondary | ICD-10-CM | POA: Diagnosis not present

## 2023-03-22 DIAGNOSIS — I10 Essential (primary) hypertension: Secondary | ICD-10-CM | POA: Diagnosis not present

## 2023-03-29 DIAGNOSIS — H524 Presbyopia: Secondary | ICD-10-CM | POA: Diagnosis not present

## 2023-03-29 DIAGNOSIS — H401111 Primary open-angle glaucoma, right eye, mild stage: Secondary | ICD-10-CM | POA: Diagnosis not present

## 2023-04-10 DIAGNOSIS — Z0100 Encounter for examination of eyes and vision without abnormal findings: Secondary | ICD-10-CM | POA: Diagnosis not present

## 2023-04-18 DIAGNOSIS — H01001 Unspecified blepharitis right upper eyelid: Secondary | ICD-10-CM | POA: Diagnosis not present

## 2023-04-18 DIAGNOSIS — H01002 Unspecified blepharitis right lower eyelid: Secondary | ICD-10-CM | POA: Diagnosis not present

## 2023-04-18 DIAGNOSIS — H401131 Primary open-angle glaucoma, bilateral, mild stage: Secondary | ICD-10-CM | POA: Diagnosis not present

## 2023-04-18 DIAGNOSIS — H01004 Unspecified blepharitis left upper eyelid: Secondary | ICD-10-CM | POA: Diagnosis not present

## 2023-06-27 DIAGNOSIS — E559 Vitamin D deficiency, unspecified: Secondary | ICD-10-CM | POA: Diagnosis not present

## 2023-06-27 DIAGNOSIS — R7303 Prediabetes: Secondary | ICD-10-CM | POA: Diagnosis not present

## 2023-06-27 DIAGNOSIS — I10 Essential (primary) hypertension: Secondary | ICD-10-CM | POA: Diagnosis not present

## 2023-06-27 DIAGNOSIS — E782 Mixed hyperlipidemia: Secondary | ICD-10-CM | POA: Diagnosis not present

## 2023-07-15 ENCOUNTER — Other Ambulatory Visit (HOSPITAL_COMMUNITY): Payer: Self-pay | Admitting: Family Medicine

## 2023-07-15 DIAGNOSIS — E669 Obesity, unspecified: Secondary | ICD-10-CM | POA: Diagnosis not present

## 2023-07-15 DIAGNOSIS — E559 Vitamin D deficiency, unspecified: Secondary | ICD-10-CM | POA: Diagnosis not present

## 2023-07-15 DIAGNOSIS — M25512 Pain in left shoulder: Secondary | ICD-10-CM | POA: Diagnosis not present

## 2023-07-15 DIAGNOSIS — R059 Cough, unspecified: Secondary | ICD-10-CM | POA: Diagnosis not present

## 2023-07-15 DIAGNOSIS — E875 Hyperkalemia: Secondary | ICD-10-CM | POA: Diagnosis not present

## 2023-07-15 DIAGNOSIS — H409 Unspecified glaucoma: Secondary | ICD-10-CM | POA: Diagnosis not present

## 2023-07-15 DIAGNOSIS — R7303 Prediabetes: Secondary | ICD-10-CM | POA: Diagnosis not present

## 2023-07-15 DIAGNOSIS — E782 Mixed hyperlipidemia: Secondary | ICD-10-CM | POA: Diagnosis not present

## 2023-07-15 DIAGNOSIS — I10 Essential (primary) hypertension: Secondary | ICD-10-CM | POA: Diagnosis not present

## 2023-07-15 DIAGNOSIS — Z13820 Encounter for screening for osteoporosis: Secondary | ICD-10-CM

## 2023-07-15 DIAGNOSIS — J302 Other seasonal allergic rhinitis: Secondary | ICD-10-CM | POA: Diagnosis not present

## 2023-07-15 DIAGNOSIS — G4730 Sleep apnea, unspecified: Secondary | ICD-10-CM | POA: Diagnosis not present

## 2023-07-22 DIAGNOSIS — H01001 Unspecified blepharitis right upper eyelid: Secondary | ICD-10-CM | POA: Diagnosis not present

## 2023-07-22 DIAGNOSIS — H401131 Primary open-angle glaucoma, bilateral, mild stage: Secondary | ICD-10-CM | POA: Diagnosis not present

## 2023-07-22 DIAGNOSIS — H01005 Unspecified blepharitis left lower eyelid: Secondary | ICD-10-CM | POA: Diagnosis not present

## 2023-07-22 DIAGNOSIS — H11153 Pinguecula, bilateral: Secondary | ICD-10-CM | POA: Diagnosis not present

## 2023-07-22 DIAGNOSIS — H01002 Unspecified blepharitis right lower eyelid: Secondary | ICD-10-CM | POA: Diagnosis not present

## 2023-07-22 DIAGNOSIS — H11823 Conjunctivochalasis, bilateral: Secondary | ICD-10-CM | POA: Diagnosis not present

## 2023-07-22 DIAGNOSIS — H01004 Unspecified blepharitis left upper eyelid: Secondary | ICD-10-CM | POA: Diagnosis not present

## 2023-07-22 DIAGNOSIS — H04123 Dry eye syndrome of bilateral lacrimal glands: Secondary | ICD-10-CM | POA: Diagnosis not present

## 2023-07-31 DIAGNOSIS — Z1211 Encounter for screening for malignant neoplasm of colon: Secondary | ICD-10-CM | POA: Diagnosis not present

## 2023-08-08 LAB — COLOGUARD: COLOGUARD: NEGATIVE

## 2023-08-08 LAB — EXTERNAL GENERIC LAB PROCEDURE: COLOGUARD: NEGATIVE

## 2023-08-26 DIAGNOSIS — M25512 Pain in left shoulder: Secondary | ICD-10-CM | POA: Diagnosis not present

## 2023-09-05 ENCOUNTER — Encounter (INDEPENDENT_AMBULATORY_CARE_PROVIDER_SITE_OTHER): Payer: Self-pay | Admitting: *Deleted

## 2023-09-27 DIAGNOSIS — M25561 Pain in right knee: Secondary | ICD-10-CM | POA: Diagnosis not present

## 2023-09-27 DIAGNOSIS — M25562 Pain in left knee: Secondary | ICD-10-CM | POA: Diagnosis not present

## 2023-11-11 DIAGNOSIS — M25562 Pain in left knee: Secondary | ICD-10-CM | POA: Diagnosis not present

## 2023-11-22 DIAGNOSIS — S83282D Other tear of lateral meniscus, current injury, left knee, subsequent encounter: Secondary | ICD-10-CM | POA: Diagnosis not present

## 2023-11-22 DIAGNOSIS — S83242D Other tear of medial meniscus, current injury, left knee, subsequent encounter: Secondary | ICD-10-CM | POA: Diagnosis not present

## 2023-11-25 DIAGNOSIS — H01002 Unspecified blepharitis right lower eyelid: Secondary | ICD-10-CM | POA: Diagnosis not present

## 2023-11-25 DIAGNOSIS — H01001 Unspecified blepharitis right upper eyelid: Secondary | ICD-10-CM | POA: Diagnosis not present

## 2023-11-25 DIAGNOSIS — H401131 Primary open-angle glaucoma, bilateral, mild stage: Secondary | ICD-10-CM | POA: Diagnosis not present

## 2023-11-25 DIAGNOSIS — H01004 Unspecified blepharitis left upper eyelid: Secondary | ICD-10-CM | POA: Diagnosis not present

## 2023-12-09 DIAGNOSIS — L259 Unspecified contact dermatitis, unspecified cause: Secondary | ICD-10-CM | POA: Diagnosis not present

## 2023-12-09 DIAGNOSIS — I10 Essential (primary) hypertension: Secondary | ICD-10-CM | POA: Diagnosis not present

## 2023-12-31 DIAGNOSIS — I781 Nevus, non-neoplastic: Secondary | ICD-10-CM | POA: Diagnosis not present

## 2023-12-31 DIAGNOSIS — L570 Actinic keratosis: Secondary | ICD-10-CM | POA: Diagnosis not present

## 2023-12-31 DIAGNOSIS — L309 Dermatitis, unspecified: Secondary | ICD-10-CM | POA: Diagnosis not present

## 2023-12-31 DIAGNOSIS — L821 Other seborrheic keratosis: Secondary | ICD-10-CM | POA: Diagnosis not present

## 2024-01-08 DIAGNOSIS — I10 Essential (primary) hypertension: Secondary | ICD-10-CM | POA: Diagnosis not present

## 2024-01-08 DIAGNOSIS — E559 Vitamin D deficiency, unspecified: Secondary | ICD-10-CM | POA: Diagnosis not present

## 2024-01-08 DIAGNOSIS — R7303 Prediabetes: Secondary | ICD-10-CM | POA: Diagnosis not present

## 2024-01-17 DIAGNOSIS — I10 Essential (primary) hypertension: Secondary | ICD-10-CM | POA: Diagnosis not present

## 2024-01-17 DIAGNOSIS — G2581 Restless legs syndrome: Secondary | ICD-10-CM | POA: Diagnosis not present

## 2024-01-17 DIAGNOSIS — E875 Hyperkalemia: Secondary | ICD-10-CM | POA: Diagnosis not present

## 2024-01-17 DIAGNOSIS — Z0001 Encounter for general adult medical examination with abnormal findings: Secondary | ICD-10-CM | POA: Diagnosis not present

## 2024-01-17 DIAGNOSIS — E559 Vitamin D deficiency, unspecified: Secondary | ICD-10-CM | POA: Diagnosis not present

## 2024-01-17 DIAGNOSIS — M25562 Pain in left knee: Secondary | ICD-10-CM | POA: Diagnosis not present

## 2024-01-17 DIAGNOSIS — H409 Unspecified glaucoma: Secondary | ICD-10-CM | POA: Diagnosis not present

## 2024-01-17 DIAGNOSIS — G4730 Sleep apnea, unspecified: Secondary | ICD-10-CM | POA: Diagnosis not present

## 2024-01-17 DIAGNOSIS — E782 Mixed hyperlipidemia: Secondary | ICD-10-CM | POA: Diagnosis not present

## 2024-01-17 DIAGNOSIS — J302 Other seasonal allergic rhinitis: Secondary | ICD-10-CM | POA: Diagnosis not present

## 2024-01-17 DIAGNOSIS — R7303 Prediabetes: Secondary | ICD-10-CM | POA: Diagnosis not present

## 2024-01-20 ENCOUNTER — Other Ambulatory Visit (HOSPITAL_COMMUNITY): Payer: Self-pay | Admitting: Internal Medicine

## 2024-01-20 DIAGNOSIS — Z1231 Encounter for screening mammogram for malignant neoplasm of breast: Secondary | ICD-10-CM

## 2024-01-20 DIAGNOSIS — Z13820 Encounter for screening for osteoporosis: Secondary | ICD-10-CM

## 2024-01-30 ENCOUNTER — Ambulatory Visit (HOSPITAL_COMMUNITY)
Admission: RE | Admit: 2024-01-30 | Discharge: 2024-01-30 | Disposition: A | Discharge status: Home / Self Care | Source: Ambulatory Visit | Attending: Internal Medicine | Admitting: Internal Medicine

## 2024-01-30 ENCOUNTER — Encounter (HOSPITAL_COMMUNITY): Payer: Self-pay

## 2024-01-30 DIAGNOSIS — Z780 Asymptomatic menopausal state: Secondary | ICD-10-CM | POA: Insufficient documentation

## 2024-01-30 DIAGNOSIS — Z1231 Encounter for screening mammogram for malignant neoplasm of breast: Secondary | ICD-10-CM | POA: Diagnosis not present

## 2024-01-30 DIAGNOSIS — Z13820 Encounter for screening for osteoporosis: Secondary | ICD-10-CM | POA: Insufficient documentation

## 2024-03-30 DIAGNOSIS — H524 Presbyopia: Secondary | ICD-10-CM | POA: Diagnosis not present

## 2024-03-30 DIAGNOSIS — H35362 Drusen (degenerative) of macula, left eye: Secondary | ICD-10-CM | POA: Diagnosis not present

## 2024-04-02 DIAGNOSIS — H01001 Unspecified blepharitis right upper eyelid: Secondary | ICD-10-CM | POA: Diagnosis not present

## 2024-04-02 DIAGNOSIS — H401131 Primary open-angle glaucoma, bilateral, mild stage: Secondary | ICD-10-CM | POA: Diagnosis not present

## 2024-04-02 DIAGNOSIS — H01004 Unspecified blepharitis left upper eyelid: Secondary | ICD-10-CM | POA: Diagnosis not present

## 2024-04-02 DIAGNOSIS — H01002 Unspecified blepharitis right lower eyelid: Secondary | ICD-10-CM | POA: Diagnosis not present

## 2024-05-18 DIAGNOSIS — J069 Acute upper respiratory infection, unspecified: Secondary | ICD-10-CM | POA: Diagnosis not present

## 2024-05-25 DIAGNOSIS — H01001 Unspecified blepharitis right upper eyelid: Secondary | ICD-10-CM | POA: Diagnosis not present

## 2024-05-25 DIAGNOSIS — H401131 Primary open-angle glaucoma, bilateral, mild stage: Secondary | ICD-10-CM | POA: Diagnosis not present

## 2024-05-25 DIAGNOSIS — H25813 Combined forms of age-related cataract, bilateral: Secondary | ICD-10-CM | POA: Diagnosis not present

## 2024-05-25 DIAGNOSIS — H01002 Unspecified blepharitis right lower eyelid: Secondary | ICD-10-CM | POA: Diagnosis not present

## 2024-06-08 DIAGNOSIS — R058 Other specified cough: Secondary | ICD-10-CM | POA: Diagnosis not present

## 2024-06-08 DIAGNOSIS — N898 Other specified noninflammatory disorders of vagina: Secondary | ICD-10-CM | POA: Diagnosis not present

## 2024-06-09 ENCOUNTER — Other Ambulatory Visit (HOSPITAL_COMMUNITY): Payer: Self-pay | Admitting: Radiology

## 2024-06-09 DIAGNOSIS — R058 Other specified cough: Secondary | ICD-10-CM

## 2024-06-22 DIAGNOSIS — H25812 Combined forms of age-related cataract, left eye: Secondary | ICD-10-CM | POA: Diagnosis not present

## 2024-06-24 ENCOUNTER — Encounter (HOSPITAL_COMMUNITY)
Admission: RE | Admit: 2024-06-24 | Discharge: 2024-06-24 | Disposition: A | Discharge status: Home / Self Care | Source: Ambulatory Visit | Attending: General Surgery | Admitting: General Surgery

## 2024-06-24 ENCOUNTER — Encounter (HOSPITAL_COMMUNITY): Payer: Self-pay

## 2024-06-24 ENCOUNTER — Other Ambulatory Visit: Payer: Self-pay

## 2024-06-24 NOTE — Pre-Procedure Instructions (Signed)
 Attempted pre-op phonecall. Left VM for her to call us  back.

## 2024-06-24 NOTE — H&P (Signed)
 Surgical History & Physical  Patient Name: Janice Gonzales  DOB: May 26, 1956  Surgery: Cataract extraction with intraocular lens implant phacoemulsification; Left Eye Surgeon: Lynwood Hermann MD Surgery Date: 06/26/2024 Pre-Op Date: 05/25/2024  HPI: A 35 Yr. old female patient 1. The patient is here today for a Cataract Evaluation. The patient complains of difficulty when driving at night, which began 6 months ago. Both eyes are affected. The episode is constant. The patient describes foggy and hazy symptoms affecting their eyes/vision. This is negatively affecting the patient's quality of life and the patient is unable to function adequately in life with the current level of vision. HPI Completed by Dr. Lynwood Hermann  Medical History: Dry Eyes Glaucoma Cataracts Floater, Astigmatism, Presbyopia  High Blood Pressure  Review of Systems Cardiovascular High Blood Pressure All recorded systems are negative except as noted above.  Social Never smoked  Medication Ocular Systemic Iyuzeh (PF),  Vitamin C, Vitamin D3,  losartan ,  levocetirizine   Sx/Procedures Laser Trabeculoplasty, Laser Trabeculoplasty, Laser Trabeculoplasty,  Ankle Surgery   Drug Allergies  Tetracycline  History & Physical: Heent: cataracts NECK: supple without bruits LUNGS: lungs clear to auscultation CV: regular rate and rhythm Abdomen: soft and non-tender  Impression & Plan: Assessment: 1.  COMBINED FORMS AGE RELATED CATARACT; Both Eyes (H25.813) 2.  PRIMARY OPEN ANGLE GLAUCOMA; Both Eyes Mild (H40.1131) 3.  BLEPHARITIS; Right Upper Lid, Right Lower Lid, Left Upper Lid, Left Lower Lid (H01.001, H01.002,H01.004,H01.005) 4.  Pinguecula; Both Eyes (H11.153) 5.  Posterior Vitreous Detachment; Left Eye (H43.812) 6.  CONJUNCTIVOCHALASIS; Both Eyes (H11.823) 7.  Dry Eye Syndrome; Both Eyes (H04.123) 8.  ASTIGMATISM, REGULAR; Both Eyes (H52.223)  Plan: 1.  Cataract accounts for the patient's decreased vision.  This visual impairment is not correctable with a tolerable change in glasses or contact lenses. Cataract surgery with an implantation of a new lens should significantly improve the visual and functional status of the patient. Recommend phacoemulsification with intraocular lens. Discussed all risks, benefits, alternatives, and potential complications. Discussed the procedures and recovery. The patient desires to have surgery. A-scan/Biometry ordered and will be performed for intraocular lens calculations. The surgery will be performed in order to improve vision for driving, reading, and for eye examinations. Recommend Dextenza  for post-operative pain and inflammation. Educational materials provided: Cataract. History of corneal refractive Surgery: None History of Previous Ocular Surgery (PPV, other): None History of ocular trauma: None Use of Eye Pressure Lowering Drops: Iyuzeh No current contact lens use.- used previously. Left Eye worse. OS first, then OD. Pupil Status: Dilates well - shugarcaine or Lidocaine+Omidira by protocol Glaucoma Treatment: Recommend goniotomy to control pressure in the setting of glaucoma/ocular hypertension. Use iAccess goniotomy both eyes at the time of cataract surgery to improve compliance and help lower eye pressure. Standard Lens: Either at distance or near- pt to decide. Recommend Toric Lens OU Patient to decide goal - plano vs. -2.50.  2.  Has been on drops for several years. S/p SLT OS 09/13/22 S/p SLT OD 10/04/22 S/P repeat SLT OU 03/18/23 Gonio shows open angles 04/02/24. Normal corneal thickness.  HVF 24-2 shows defects OU 12/24, stable OCT rNFL shows thinning OU 12/24, stable  IOP improved today after starring drops. Continue Iyuzeh 1 drop both eyes every night at bedtime. Recommend goniotomy (iAccess) both eyes at the time of cataract surgery to improve compliance and help lower eye pressure.  Detailed discussion about glaucoma today including  importance of maintaining good follow up and following treatment plan, and the possibility  of irreversible blindness as part of this disease process. Longitudinal care considerations were addressed as part of this visit related to the patient's complex and ongoing/chronic medical condition. Extra time was spent with patient discussing the importance of following medical care instructions and recommendations to help achieve positive long-term outcomes.  3.  Recommend regular lid cleaning.  4.  Observe; Artificial tears as needed for irritation.  5.  No retinal tear or retinal detachment. Call with any worsening vision, pain, or any other concerns.  6.  Observe; Artificial tears as needed for irritation.  7.  Preservative Free Artificial tears 1 drop 2-3x/day.  8.  Recommend Toric Lens OU

## 2024-06-26 ENCOUNTER — Ambulatory Visit (HOSPITAL_COMMUNITY)
Admission: RE | Admit: 2024-06-26 | Discharge: 2024-06-26 | Disposition: A | Discharge status: Home / Self Care | Attending: Ophthalmology | Admitting: Ophthalmology

## 2024-06-26 ENCOUNTER — Ambulatory Visit (HOSPITAL_COMMUNITY): Admitting: Student

## 2024-06-26 ENCOUNTER — Encounter (HOSPITAL_COMMUNITY)
Admission: RE | Disposition: A | Discharge status: Home / Self Care | Payer: Self-pay | Source: Home / Self Care | Attending: Ophthalmology

## 2024-06-26 DIAGNOSIS — I10 Essential (primary) hypertension: Secondary | ICD-10-CM | POA: Insufficient documentation

## 2024-06-26 DIAGNOSIS — H401121 Primary open-angle glaucoma, left eye, mild stage: Secondary | ICD-10-CM

## 2024-06-26 DIAGNOSIS — M1990 Unspecified osteoarthritis, unspecified site: Secondary | ICD-10-CM | POA: Diagnosis not present

## 2024-06-26 DIAGNOSIS — H52222 Regular astigmatism, left eye: Secondary | ICD-10-CM | POA: Diagnosis not present

## 2024-06-26 DIAGNOSIS — G4730 Sleep apnea, unspecified: Secondary | ICD-10-CM | POA: Insufficient documentation

## 2024-06-26 DIAGNOSIS — H401112 Primary open-angle glaucoma, right eye, moderate stage: Secondary | ICD-10-CM | POA: Diagnosis not present

## 2024-06-26 DIAGNOSIS — H5712 Ocular pain, left eye: Secondary | ICD-10-CM | POA: Diagnosis not present

## 2024-06-26 DIAGNOSIS — H25812 Combined forms of age-related cataract, left eye: Secondary | ICD-10-CM | POA: Insufficient documentation

## 2024-06-26 HISTORY — PX: INSERTION, STENT, DRUG-ELUTING, LACRIMAL CANALICULUS: SHX7453

## 2024-06-26 HISTORY — PX: CATARACT EXTRACTION W/PHACO: SHX586

## 2024-06-26 HISTORY — PX: GONIOTOMY: SHX7582

## 2024-06-26 SURGERY — PHACOEMULSIFICATION, CATARACT, WITH IOL INSERTION
Anesthesia: Monitor Anesthesia Care | Site: Eye | Laterality: Left

## 2024-06-26 MED ORDER — LIDOCAINE HCL 3.5 % OP GEL
1.0000 | Freq: Once | OPHTHALMIC | Status: AC
  Administered 2024-06-26: 1 via OPHTHALMIC

## 2024-06-26 MED ORDER — LACTATED RINGERS IV SOLN: INTRAVENOUS | Status: DC

## 2024-06-26 MED ORDER — MOXIFLOXACIN HCL 5 MG/ML IO SOLN
INTRAOCULAR | Status: DC | PRN
  Administered 2024-06-26: .3 mL via INTRACAMERAL

## 2024-06-26 MED ORDER — PHENYLEPHRINE-KETOROLAC 1-0.3 % IO SOLN
INTRAOCULAR | Status: DC | PRN
  Administered 2024-06-26: 500 mL via OPHTHALMIC

## 2024-06-26 MED ORDER — STERILE WATER FOR IRRIGATION IR SOLN
Status: DC | PRN
Start: 2024-06-26 — End: 2024-06-26
  Administered 2024-06-26: 1

## 2024-06-26 MED ORDER — DEXAMETHASONE 0.4 MG OP INST
VAGINAL_INSERT | OPHTHALMIC | Status: AC
  Filled 2024-06-26: qty 1

## 2024-06-26 MED ORDER — TETRACAINE HCL 0.5 % OP SOLN
1.0000 [drp] | OPHTHALMIC | Status: AC | PRN
  Administered 2024-06-26 (×3): 1 [drp] via OPHTHALMIC

## 2024-06-26 MED ORDER — BSS IO SOLN
INTRAOCULAR | Status: DC | PRN
  Administered 2024-06-26: 15 mL via INTRAOCULAR

## 2024-06-26 MED ORDER — DEXAMETHASONE 0.4 MG OP INST
VAGINAL_INSERT | OPHTHALMIC | Status: DC | PRN
  Administered 2024-06-26: .4 mg via OPHTHALMIC

## 2024-06-26 MED ORDER — POVIDONE-IODINE 5 % OP SOLN
OPHTHALMIC | Status: DC | PRN
  Administered 2024-06-26: 1 via OPHTHALMIC

## 2024-06-26 MED ORDER — PHENYLEPHRINE HCL 2.5 % OP SOLN
1.0000 [drp] | OPHTHALMIC | Status: AC | PRN
  Administered 2024-06-26 (×3): 1 [drp] via OPHTHALMIC

## 2024-06-26 MED ORDER — SODIUM HYALURONATE 23MG/ML IO SOSY
PREFILLED_SYRINGE | INTRAOCULAR | Status: DC | PRN
  Administered 2024-06-26: .6 mL via INTRAOCULAR

## 2024-06-26 MED ORDER — SODIUM HYALURONATE 10 MG/ML IO SOLUTION
PREFILLED_SYRINGE | INTRAOCULAR | Status: DC | PRN
  Administered 2024-06-26: .85 mL via INTRAOCULAR

## 2024-06-26 MED ORDER — TROPICAMIDE 1 % OP SOLN
1.0000 [drp] | OPHTHALMIC | Status: DC | PRN
  Administered 2024-06-26 (×2): 1 [drp] via OPHTHALMIC

## 2024-06-26 MED ORDER — LIDOCAINE HCL (PF) 1 % IJ SOLN
INTRAMUSCULAR | Status: DC | PRN
  Administered 2024-06-26: 1 mL

## 2024-06-26 SURGICAL SUPPLY — 13 items
CLIP IPRISM (KITS) IMPLANT
CLOTH BEACON ORANGE TIMEOUT ST (SAFETY) ×1 IMPLANT
EYE SHIELD UNIVERSAL CLEAR (GAUZE/BANDAGES/DRESSINGS) IMPLANT
FEE CATARACT SUITE SIGHTPATH (MISCELLANEOUS) ×1 IMPLANT
GLOVE BIOGEL PI IND STRL 7.0 (GLOVE) ×2 IMPLANT
LENS IOL EYHANCE TRC 225 19.0 IMPLANT
NDL HYPO 18GX1.5 BLUNT FILL (NEEDLE) ×2 IMPLANT
NEEDLE HYPO 18GX1.5 BLUNT FILL (NEEDLE) ×1 IMPLANT
PAD ARMBOARD POSITIONER FOAM (MISCELLANEOUS) ×1 IMPLANT
SYR TB 1ML LL NO SAFETY (SYRINGE) ×1 IMPLANT
TAPE SURG TRANSPORE 1 IN (GAUZE/BANDAGES/DRESSINGS) IMPLANT
TREPHINE GLAUKO IACCESS TRBCLR (OPHTHALMIC) IMPLANT
WATER STERILE IRR 250ML POUR (IV SOLUTION) ×1 IMPLANT

## 2024-06-26 NOTE — Anesthesia Preprocedure Evaluation (Signed)
 Anesthesia Evaluation  Patient identified by MRN, date of birth, ID band Patient awake    Reviewed: Allergy & Precautions, H&P , NPO status , Patient's Chart, lab work & pertinent test results, reviewed documented beta blocker date and time   Airway Mallampati: II  TM Distance: >3 FB Neck ROM: full    Dental no notable dental hx. (+) Dental Advisory Given, Teeth Intact   Pulmonary sleep apnea    Pulmonary exam normal breath sounds clear to auscultation       Cardiovascular Exercise Tolerance: Good hypertension, Normal cardiovascular exam Rhythm:regular Rate:Normal     Neuro/Psych glaucoma negative neurological ROS  negative psych ROS   GI/Hepatic negative GI ROS, Neg liver ROS,,,  Endo/Other  negative endocrine ROS    Renal/GU negative Renal ROS  negative genitourinary   Musculoskeletal  (+) Arthritis , Osteoarthritis,    Abdominal   Peds  Hematology negative hematology ROS (+)   Anesthesia Other Findings DVT  Reproductive/Obstetrics negative OB ROS                              Anesthesia Physical Anesthesia Plan  ASA: 3  Anesthesia Plan: MAC   Post-op Pain Management: Minimal or no pain anticipated   Induction:   PONV Risk Score and Plan: Midazolam   Airway Management Planned: Nasal Cannula and Natural Airway  Additional Equipment: None  Intra-op Plan:   Post-operative Plan:   Informed Consent: I have reviewed the patients History and Physical, chart, labs and discussed the procedure including the risks, benefits and alternatives for the proposed anesthesia with the patient or authorized representative who has indicated his/her understanding and acceptance.     Dental Advisory Given  Plan Discussed with: CRNA  Anesthesia Plan Comments:         Anesthesia Quick Evaluation

## 2024-06-26 NOTE — Discharge Instructions (Addendum)
 Please discharge patient when stable, will follow up today with Dr. June Leap at the [facility] office immediately following discharge.  Leave shield in place until visit.  All paperwork with discharge instructions will be given at the office.  [facility] Address:  [address]  [place], [place] [address]

## 2024-06-26 NOTE — Anesthesia Postprocedure Evaluation (Signed)
 Anesthesia Post Note  Patient: NAW LASALA  Procedure(s) Performed: PHACOEMULSIFICATION, CATARACT, WITH IOL INSERTION (Left: Eye) INSERTION, STENT, DRUG-ELUTING, LACRIMAL CANALICULUS (Left: Eye) GONIOTOMY (Left: Eye)  Patient location during evaluation: Phase II Anesthesia Type: MAC Level of consciousness: awake and alert Pain management: pain level controlled Vital Signs Assessment: post-procedure vital signs reviewed and stable Respiratory status: spontaneous breathing, nonlabored ventilation and respiratory function stable Cardiovascular status: stable Anesthetic complications: no   There were no known notable events for this encounter.   Last Vitals:  Vitals:   06/26/24 1208 06/26/24 1211  BP:  128/86  Pulse: 82   Resp: 14   Temp: 36.9 C   SpO2: 97%     Last Pain:  Vitals:   06/26/24 1208  TempSrc: Oral  PainSc: 0-No pain                 Shaquavia Whisonant L Jerilyn Gillaspie

## 2024-06-26 NOTE — Interval H&P Note (Signed)
 History and Physical Interval Note:  06/26/2024 11:35 AM  Janice Gonzales  has presented today for surgery, with the diagnosis of combined forms age related cataract, left eye  primary open angle glaucoma, left eye.  The various methods of treatment have been discussed with the patient and family. After consideration of risks, benefits and other options for treatment, the patient has consented to  Procedure(s) with comments: PHACOEMULSIFICATION, CATARACT, WITH IOL INSERTION (Left) INSERTION, STENT, DRUG-ELUTING, LACRIMAL CANALICULUS (Left) GONIOTOMY (Left) - iAccess Goniotomy as a surgical intervention.  The patient's history has been reviewed, patient examined, no change in status, stable for surgery.  I have reviewed the patient's chart and labs.  Questions were answered to the patient's satisfaction.     HARRIE AGENT

## 2024-06-26 NOTE — Transfer of Care (Signed)
 Immediate Anesthesia Transfer of Care Note  Patient: Janice Gonzales  Procedure(s) Performed: PHACOEMULSIFICATION, CATARACT, WITH IOL INSERTION (Left: Eye) INSERTION, STENT, DRUG-ELUTING, LACRIMAL CANALICULUS (Left: Eye) GONIOTOMY (Left: Eye)  Patient Location: PACU  Anesthesia Type:MAC  Level of Consciousness: awake and alert   Airway & Oxygen Therapy: Patient Spontanous Breathing  Post-op Assessment: Report given to RN  Post vital signs: Reviewed  Last Vitals:  Vitals Value Taken Time  BP 147/95   Temp 97.4   Pulse 74   Resp 10   SpO2 97     Last Pain:  Vitals:   06/26/24 1057  TempSrc: Oral  PainSc: 0-No pain      Patients Stated Pain Goal: 6 (06/26/24 1057)  Complications: There were no known notable events for this encounter.

## 2024-06-26 NOTE — Op Note (Signed)
 Date of procedure: 06/26/24  Pre-operative diagnosis:  1) Visually significant age-related combined form cataract, Left Eye (H25.812) 2) Primary Open Angle Glaucoma, Mild Stage, Left Eye 3) Regular astigmatism, Left Eye  Post-operative diagnosis: 1) Visually significant age-related cataract, Left Eye 2) Primary Open Angle Glaucoma, Left Stage, Left Eye 3) Regular astigmatism, Left Eye 4) Pain and inflammation after cataract surgery, Left eye  Procedure:  1) Removal of cataract via phacoemulsification and insertion of intra-ocular lens Vicci and Johnson [id number] +19.0D into the capsular bag of the Left Eye 2) Goniotomy treatment of nasal trabecular meshwork with iAccess, Left Eye 3) Placement of Dextenza  insert, left lower eyelid  Attending surgeon: Lynwood LABOR. Bennett Ram, MD, MA  Anesthesia: MAC, Topical Akten  Complications: None  Estimated Blood Loss: <30mL (minimal)  Specimens: None  Implants: As above  Indications:  Visually significant age-related cataract, Left Eye; Glaucoma, Left Eye  Procedure:  The patient was seen and identified in the pre-operative area. The operative eye was identified and dilated.  The operative eye was marked at 180 and 270 degrees.  Topical anesthesia was administered to the operative eye.     The patient was then to the operative suite and placed in the supine position.  A timeout was performed confirming the patient, procedure to be performed, and all other relevant information.   The patient's face was prepped and draped in the usual fashion for intra-ocular surgery.  A lid speculum was placed into the operative eye and the surgical microscope moved into place and focused. An inferotemporal paracentesis was created using a 20 gauge paracentesis blade. Omidria was injected into the anterior chamber. Shugarcaine was injected into the anterior chamber.  Viscoelastic was injected into the anterior chamber.  A temporal clear-corneal main wound incision was  created using a 2.61mm microkeratome.  A continuous curvilinear capsulorrhexis was initiated using an irrigating cystitome and completed using capsulorrhexis forceps.  Hydrodissection and hydrodeliniation were performed.  Viscoelastic was injected into the anterior chamber.  A phacoemulsification handpiece and a chopper as a second instrument were used to remove the nucleus and epinucleus. The irrigation/aspiration handpiece was used to remove any remaining cortical material.   The capsular bag was reinflated with viscoelastic, checked, and found to be intact. The eye was marked at 165 degrees. The intraocular lens was inserted into the capsular bag and dialed into place using a Kuglen hook at the correct toric position...    The patient's head was repositioned.  The iAccess was used to extensively treat the nasal trabecular meshwork for 90 degrees for a total of 5 goniotomies under gonioscopy.  The patient's head was returned to neutral.  The irrigation/aspiration handpiece was used to remove any remaining viscoelastic.  The clear corneal wound and paracentesis wounds were then hydrated and checked with Weck-Cels to be watertight. 0.1mL of Moxfloxacin was injected into the anterior chamber. The lid-speculum was removed.   The lower punctum was dilated and filled with Provisc. A Dextenza  implant was placed in the lower canaliculus without complication.  The drape was removed.  The patient's face was cleaned with a wet and dry 4x4. A clear shield was taped over the eye. The patient was taken to the post-operative care unit in good condition, having tolerated the procedure well.  Post-Op Instructions: The patient will follow up at [facility] for a same day post-operative evaluation and will receive all other orders and instructions.

## 2024-06-29 ENCOUNTER — Encounter (HOSPITAL_COMMUNITY): Payer: Self-pay | Admitting: Ophthalmology

## 2024-06-29 DIAGNOSIS — N760 Acute vaginitis: Secondary | ICD-10-CM | POA: Diagnosis not present

## 2024-06-29 DIAGNOSIS — R300 Dysuria: Secondary | ICD-10-CM | POA: Diagnosis not present

## 2024-07-02 DIAGNOSIS — H25811 Combined forms of age-related cataract, right eye: Secondary | ICD-10-CM | POA: Diagnosis not present

## 2024-07-03 NOTE — H&P (Signed)
 Surgical History & Physical  Patient Name: Janice Gonzales  DOB: 09-17-1955  Surgery: Cataract extraction with intraocular lens implant phacoemulsification; Right Eye Surgeon: Lynwood Hermann MD Surgery Date: 07/10/2024 Pre-Op Date: 07/02/2024  HPI: A 87 Yr. old female patient 1. The patient is returning after cataract surgery. The left eye is affected. Status post cataract surgery, which began 1 week ago: Since the last visit, the affected area feels improvement. The patient's vision is stable. The condition's severity is constant. Patient is following medication instructions. 2. The patient is returning for a cataract follow-up of the right eye. Since the last visit, the affected area is tolerating. The patient's vision is blurry. The condition's severity is constant. Patient is not taking medications. This is negatively affecting the patient's quality of life and the patient is unable to function adequately in life with the current level of vision.  Medical History: Dry Eyes Glaucoma Cataracts Floater, Astigmatism, Presbyopia  High Blood Pressure  Review of Systems Cardiovascular High Blood Pressure All recorded systems are negative except as noted above.  Social Never smoked   Medication Iyuzeh (PF), Prednisolone-moxiflox-bromfen,  Vitamin C, Vitamin D3,  losartan ,  levocetirizine   Sx/Procedures Laser Trabeculoplasty, Laser Trabeculoplasty, Laser Trabeculoplasty, Phaco c IOL OS-dextenza -toric access, Ankle Surgery  Drug Allergies  Tetracycline  History & Physical: Heent: cataract NECK: supple without bruits LUNGS: lungs clear to auscultation CV: regular rate and rhythm Abdomen: soft and non-tender  Impression & Plan: Assessment: 1.  CATARACT EXTRACTION STATUS; Left Eye (Z98.42) 2.  COMBINED FORMS AGE RELATED CATARACT; Right Eye (H25.811) 3.  PRIMARY OPEN ANGLE GLAUCOMA; Both Eyes Mild (H40.1131) 4.  CONJUNCTIVAL CYSTS; Right Eye (H11.441)  Plan: 1.  1 week after  cataract surgery. Doing well with improved vision and normal eye pressure. Call with any problems or concerns. Continue Pred-Mox-Brom Combo drop 2x/day for 1 more week.  2.  Cataract accounts for the patient's decreased vision. This visual impairment is not correctable with a tolerable change in glasses or contact lenses. Cataract surgery with an implantation of a new lens should significantly improve the visual and functional status of the patient. Recommend phacoemulsification with intraocular lens. Discussed all risks, benefits, alternatives, and potential complications. Discussed the procedures and recovery. The patient desires to have surgery. A-scan/Biometry ordered and will be performed for intraocular lens calculations. The surgery will be performed in order to improve vision for driving, reading, and for eye examinations. Recommend Dextenza  for post-operative pain and inflammation. Educational materials provided: Cataract. History of corneal refractive Surgery: None History of Previous Ocular Surgery (PPV, other): None History of ocular trauma: None Use of Eye Pressure Lowering Drops: Iyuzeh No current contact lens use.- used previously. Right Eye. Pupil Status: Dilates well - shugarcaine or Lidocaine+Omidira by protocol Glaucoma Treatment: Recommend goniotomy to control pressure in the setting of glaucoma/ocular hypertension. Use iAccess goniotomy both eyes at the time of cataract surgery to improve compliance and help lower eye pressure. Standard Lens: Either at distance or near- pt to decide. Recommend Toric Lens OU Patient to decide goal - plano vs. -2.50.  3.  Has been on drops for several years. S/p SLT OS 09/13/22 S/p SLT OD 10/04/22 S/P repeat SLT OU 03/18/23 Gonio shows open angles 04/02/24. Normal corneal thickness.  HVF 24-2 shows defects OU 12/24, stable OCT rNFL shows thinning OU 12/24, stable  IOP improved today after starring drops. Continue Iatanoprost OD only  QHS. Recommend goniotomy (iAccess) both eyes at the time of cataract surgery to improve compliance and help  lower eye pressure.  Detailed discussion about glaucoma today including importance of maintaining good follow up and following treatment plan, and the possibility of irreversible blindness as part of this disease process. Longitudinal care considerations were addressed as part of this visit related to the patient's complex and ongoing/chronic medical condition. Extra time was spent with patient discussing the importance of following medical care instructions and recommendations to help achieve positive long-term outcomes.  4.  New. Findings, prognosis and treatment options reviewed.

## 2024-07-07 ENCOUNTER — Encounter (HOSPITAL_COMMUNITY)
Admission: RE | Admit: 2024-07-07 | Discharge: 2024-07-07 | Disposition: A | Discharge status: Home / Self Care | Source: Ambulatory Visit | Attending: Ophthalmology | Admitting: Ophthalmology

## 2024-07-07 ENCOUNTER — Encounter (HOSPITAL_COMMUNITY): Payer: Self-pay

## 2024-07-10 ENCOUNTER — Ambulatory Visit (HOSPITAL_COMMUNITY)
Admission: RE | Admit: 2024-07-10 | Discharge: 2024-07-10 | Disposition: A | Discharge status: Home / Self Care | Attending: Ophthalmology | Admitting: Ophthalmology

## 2024-07-10 ENCOUNTER — Encounter (HOSPITAL_COMMUNITY): Payer: Self-pay | Admitting: Ophthalmology

## 2024-07-10 ENCOUNTER — Ambulatory Visit (HOSPITAL_COMMUNITY): Admitting: Certified Registered"

## 2024-07-10 ENCOUNTER — Encounter (HOSPITAL_COMMUNITY)
Admission: RE | Disposition: A | Discharge status: Home / Self Care | Payer: Self-pay | Source: Home / Self Care | Attending: Ophthalmology

## 2024-07-10 ENCOUNTER — Other Ambulatory Visit: Payer: Self-pay

## 2024-07-10 DIAGNOSIS — H26491 Other secondary cataract, right eye: Secondary | ICD-10-CM | POA: Diagnosis not present

## 2024-07-10 DIAGNOSIS — G4730 Sleep apnea, unspecified: Secondary | ICD-10-CM | POA: Insufficient documentation

## 2024-07-10 DIAGNOSIS — H52201 Unspecified astigmatism, right eye: Secondary | ICD-10-CM | POA: Insufficient documentation

## 2024-07-10 DIAGNOSIS — H25811 Combined forms of age-related cataract, right eye: Secondary | ICD-10-CM | POA: Insufficient documentation

## 2024-07-10 DIAGNOSIS — H401111 Primary open-angle glaucoma, right eye, mild stage: Secondary | ICD-10-CM

## 2024-07-10 DIAGNOSIS — H11441 Conjunctival cysts, right eye: Secondary | ICD-10-CM | POA: Insufficient documentation

## 2024-07-10 DIAGNOSIS — I10 Essential (primary) hypertension: Secondary | ICD-10-CM

## 2024-07-10 DIAGNOSIS — H2511 Age-related nuclear cataract, right eye: Secondary | ICD-10-CM | POA: Diagnosis not present

## 2024-07-10 DIAGNOSIS — E785 Hyperlipidemia, unspecified: Secondary | ICD-10-CM

## 2024-07-10 DIAGNOSIS — H5711 Ocular pain, right eye: Secondary | ICD-10-CM | POA: Diagnosis not present

## 2024-07-10 HISTORY — PX: CATARACT EXTRACTION W/PHACO: SHX586

## 2024-07-10 HISTORY — PX: GONIOTOMY: SHX7582

## 2024-07-10 HISTORY — PX: INSERTION, STENT, DRUG-ELUTING, LACRIMAL CANALICULUS: SHX7453

## 2024-07-10 SURGERY — PHACOEMULSIFICATION, CATARACT, WITH IOL INSERTION
Anesthesia: Monitor Anesthesia Care | Site: Eye | Laterality: Right

## 2024-07-10 MED ORDER — BSS IO SOLN
INTRAOCULAR | Status: DC | PRN
  Administered 2024-07-10: 15 mL via INTRAOCULAR

## 2024-07-10 MED ORDER — LIDOCAINE HCL (PF) 1 % IJ SOLN
INTRAMUSCULAR | Status: DC | PRN
Start: 2024-07-10 — End: 2024-07-10
  Administered 2024-07-10 (×2): 1 mL

## 2024-07-10 MED ORDER — DEXAMETHASONE 0.4 MG OP INST
VAGINAL_INSERT | OPHTHALMIC | Status: AC
Start: 2024-07-10 — End: 2024-07-10
  Filled 2024-07-10: qty 1

## 2024-07-10 MED ORDER — MIDAZOLAM HCL (PF) 2 MG/2ML IJ SOLN
INTRAMUSCULAR | Status: DC | PRN
  Administered 2024-07-10: .5 mg via INTRAVENOUS
  Administered 2024-07-10: 1.5 mg via INTRAVENOUS

## 2024-07-10 MED ORDER — PHENYLEPHRINE-KETOROLAC 1-0.3 % IO SOLN
INTRAOCULAR | Status: DC | PRN
  Administered 2024-07-10: 500 mL via OPHTHALMIC

## 2024-07-10 MED ORDER — LIDOCAINE HCL 3.5 % OP GEL: 1.0000 | Freq: Once | OPHTHALMIC | Status: DC

## 2024-07-10 MED ORDER — POVIDONE-IODINE 5 % OP SOLN
OPHTHALMIC | Status: DC | PRN
  Administered 2024-07-10: 1 via OPHTHALMIC

## 2024-07-10 MED ORDER — MOXIFLOXACIN HCL 5 MG/ML IO SOLN
INTRAOCULAR | Status: DC | PRN
  Administered 2024-07-10: .3 mL via INTRACAMERAL

## 2024-07-10 MED ORDER — SODIUM HYALURONATE 10 MG/ML IO SOLUTION
PREFILLED_SYRINGE | INTRAOCULAR | Status: DC | PRN
  Administered 2024-07-10: .85 mL via INTRAOCULAR

## 2024-07-10 MED ORDER — MIDAZOLAM HCL 2 MG/2ML IJ SOLN
INTRAMUSCULAR | Status: AC
Start: 2024-07-10 — End: 2024-07-10
  Filled 2024-07-10: qty 2

## 2024-07-10 MED ORDER — TROPICAMIDE 1 % OP SOLN
1.0000 [drp] | OPHTHALMIC | Status: AC | PRN
  Administered 2024-07-10 (×3): 1 [drp] via OPHTHALMIC

## 2024-07-10 MED ORDER — SODIUM HYALURONATE 23MG/ML IO SOSY
PREFILLED_SYRINGE | INTRAOCULAR | Status: DC | PRN
  Administered 2024-07-10: .6 mL via INTRAOCULAR

## 2024-07-10 MED ORDER — PHENYLEPHRINE HCL 2.5 % OP SOLN
1.0000 [drp] | OPHTHALMIC | Status: AC | PRN
  Administered 2024-07-10 (×3): 1 [drp] via OPHTHALMIC

## 2024-07-10 MED ORDER — DEXAMETHASONE 0.4 MG OP INST
VAGINAL_INSERT | OPHTHALMIC | Status: AC
  Filled 2024-07-10: qty 1

## 2024-07-10 MED ORDER — DEXAMETHASONE 0.4 MG OP INST
VAGINAL_INSERT | OPHTHALMIC | Status: DC | PRN
  Administered 2024-07-10: .4 mg via OPHTHALMIC

## 2024-07-10 MED ORDER — TETRACAINE HCL 0.5 % OP SOLN
1.0000 [drp] | OPHTHALMIC | Status: AC | PRN
  Administered 2024-07-10 (×3): 1 [drp] via OPHTHALMIC

## 2024-07-10 MED ORDER — STERILE WATER FOR IRRIGATION IR SOLN
Status: DC | PRN
  Administered 2024-07-10: 10 mL

## 2024-07-10 MED ORDER — SODIUM CHLORIDE 0.9% FLUSH
INTRAVENOUS | Status: DC | PRN
  Administered 2024-07-10: 5 mL via INTRAVENOUS
  Administered 2024-07-10: 10 mL via INTRAVENOUS

## 2024-07-10 SURGICAL SUPPLY — 14 items
CLOTH BEACON ORANGE TIMEOUT ST (SAFETY) ×1 IMPLANT
EYE SHIELD UNIVERSAL CLEAR (GAUZE/BANDAGES/DRESSINGS) IMPLANT
FEE CATARACT SUITE SIGHTPATH (MISCELLANEOUS) ×1 IMPLANT
GLOVE BIOGEL PI IND STRL 6.5 (GLOVE) IMPLANT
GLOVE BIOGEL PI IND STRL 7.0 (GLOVE) ×2 IMPLANT
LENS IOL EYHANCE TRC 225 19.5 IMPLANT
NDL HYPO 18GX1.5 BLUNT FILL (NEEDLE) ×2 IMPLANT
NEEDLE HYPO 18GX1.5 BLUNT FILL (NEEDLE) ×1 IMPLANT
PAD ARMBOARD POSITIONER FOAM (MISCELLANEOUS) ×1 IMPLANT
SYR TB 1ML LL NO SAFETY (SYRINGE) ×1 IMPLANT
SYSTEM GLAUKOS IPRIME VSCDLVRY (OPHTHALMIC) IMPLANT
TAPE SURG TRANSPORE 1 IN (GAUZE/BANDAGES/DRESSINGS) IMPLANT
TREPHINE GLAUKO IACCESS TRBCLR (OPHTHALMIC) IMPLANT
WATER STERILE IRR 250ML POUR (IV SOLUTION) ×1 IMPLANT

## 2024-07-10 NOTE — Anesthesia Preprocedure Evaluation (Signed)
 Anesthesia Evaluation  Patient identified by MRN, date of birth, ID band Patient awake    Reviewed: Allergy & Precautions, H&P , NPO status , Patient's Chart, lab work & pertinent test results, reviewed documented beta blocker date and time   Airway Mallampati: II  TM Distance: >3 FB Neck ROM: full    Dental no notable dental hx. (+) Dental Advisory Given, Teeth Intact   Pulmonary sleep apnea    Pulmonary exam normal breath sounds clear to auscultation       Cardiovascular Exercise Tolerance: Good hypertension, Normal cardiovascular exam Rhythm:regular Rate:Normal     Neuro/Psych glaucoma negative neurological ROS  negative psych ROS   GI/Hepatic negative GI ROS, Neg liver ROS,,,  Endo/Other  negative endocrine ROS    Renal/GU negative Renal ROS  negative genitourinary   Musculoskeletal  (+) Arthritis , Osteoarthritis,    Abdominal   Peds  Hematology negative hematology ROS (+)   Anesthesia Other Findings DVT  Reproductive/Obstetrics negative OB ROS                              Anesthesia Physical Anesthesia Plan  ASA: 3  Anesthesia Plan: MAC   Post-op Pain Management: Minimal or no pain anticipated   Induction:   PONV Risk Score and Plan: Midazolam   Airway Management Planned: Nasal Cannula and Natural Airway  Additional Equipment: None  Intra-op Plan:   Post-operative Plan:   Informed Consent: I have reviewed the patients History and Physical, chart, labs and discussed the procedure including the risks, benefits and alternatives for the proposed anesthesia with the patient or authorized representative who has indicated his/her understanding and acceptance.     Dental Advisory Given  Plan Discussed with: CRNA  Anesthesia Plan Comments:         Anesthesia Quick Evaluation

## 2024-07-10 NOTE — Anesthesia Procedure Notes (Signed)
 Date/Time: 07/10/2024 9:38 AM  Performed by: Para Jerelene CROME, CRNAOxygen Delivery Method: Nasal cannula

## 2024-07-10 NOTE — Op Note (Signed)
 Date of procedure: 07/10/24  Pre-operative diagnosis:  Visually significant age-related combined cataract, Right Eye (H25.811) Visually Significant Astigmatism, Right Eye Primary open angle glaucoma, mild stage, right eye  Post-operative diagnosis:  Visually significant age-related cataract, Right Eye Visually Significant Astigmatism, Right Eye Primary open angle glaucoma, mild stage, right eye Pain and inflammation after cataract surgery, Right eye  Procedure:  Removal of cataract via phacoemulsification and insertion of intra-ocular lens Vicci and Johnson [id number] +19.5D into the capsular bag of the Right Eye Goniotomy treatment with iAccess, Right Eye Placement of Dextenza  insert, Right lower eyelid  Attending surgeon: Lynwood LABOR. Haylynn Pha, MD, MA  Anesthesia: MAC, Topical Akten   Complications: None  Estimated Blood Loss: <13mL (minimal)  Specimens: None  Implants: As above  Indications:  Visually significant age-related cataract, Right Eye; Visually Significant Astigmatism, Right Eye, Primary open angle glaucoma, mild stage, right eye  Procedure:  The patient was seen and identified in the pre-operative area. The operative eye was identified and dilated.  The operative eye was marked.  Pre-operative toric markers were used to mark the eye at 0 and 180 degrees. Topical anesthesia was administered to the operative eye.     The patient was then to the operative suite and placed in the supine position.  A timeout was performed confirming the patient, procedure to be performed, and all other relevant information.   The patient's face was prepped and draped in the usual fashion for intra-ocular surgery.  A lid speculum was placed into the operative eye and the surgical microscope moved into place and focused.  A superotemporal paracentesis was created using a 20 gauge paracentesis blade. Omidria  was injected into the anterior chamber. Shugarcaine was injected into the anterior chamber.   Viscoelastic was injected into the anterior chamber.  A temporal clear-corneal main wound incision was created using a 2.53mm microkeratome.  A continuous curvilinear capsulorrhexis was initiated using an irrigating cystitome and completed using capsulorrhexis forceps.  Hydrodissection and hydrodeliniation were performed.  Viscoelastic was injected into the anterior chamber.  A phacoemulsification handpiece and a chopper as a second instrument were used to remove the nucleus and epinucleus. The irrigation/aspiration handpiece was used to remove any remaining cortical material.   The capsular bag was reinflated with viscoelastic, checked, and found to be intact.  The eye was marked to the per-op meridian.  The intraocular lens was inserted into the capsular bag and dialed into place using a Kuglen hook to 7 degrees.    Date of procedure: 07/10/24  Pre-operative diagnosis:  Primary open angle glaucoma, ?? stage, right eye H40.11??  Post-operative diagnosis:  Primary open angle glaucoma, ?? stage, right eye  Procedure: Goniotomy using iAccess of the trabecular meshwork of the right eye  Attending surgeon: Lynwood LABOR. Jairen Goldfarb, MD, MA  Anesthesia: MAC, Topical Akten   Complications: None  Estimated Blood Loss: <58mL (minimal)  Specimens: None  Implants: None  Indications:  Primary open angle glaucoma, ?? stage, right eye  Procedure:  The patient was seen and identified in the pre-operative area. The operative eye was identified and dilated.  The operative eye was marked.  Topical anesthesia was administered to the operative eye.     The patient was then to the operative suite and placed in the supine position.  A timeout was performed confirming the patient, procedure to be performed, and all other relevant information.   The patient's face was prepped and draped in the usual fashion for intra-ocular surgery.  A lid speculum was placed into the  operative eye and the surgical microscope moved into  place and focused.  A superotemporal paracentesis was created using a 20 gauge paracentesis blade.  Lidocaine  was injected into the anterior chamber.  Viscoelastic was injected into the anterior chamber.  A temporal clear-corneal main wound incision was created using a 2.40mm microkeratome.    The patient's head was repositioned and microscope moved.  The iAccess device was used to extensively treat the nasal 90 degrees of the trabecular meshwork under gonioscopy.  The patient's head was returned to previous position.  The irrigation/aspiration handpiece was used to remove any remaining viscoelastic.  The clear corneal wound and paracentesis wounds were then hydrated and checked with Weck-Cels to be watertight. 0.1mL of moxifloxacin  was injected into the anterior chamber. The lid-speculum was removed.    The lower punctum was dilated and filled with Provisc. A Dextenza  implant was placed in the lower canaliculus without complication.  The drape was removed. The patient's face was cleaned with a wet and dry 4x4.   A clear shield was taped over the eye. The patient was taken to the post-operative care unit in good condition, having tolerated the procedure well.  Post-Op Instructions: The patient will follow up at [facility] for a same day post-operative evaluation and will receive all other orders and instructions.

## 2024-07-10 NOTE — Interval H&P Note (Signed)
 History and Physical Interval Note:  07/10/2024 9:29 AM  Janice Gonzales  has presented today for surgery, with the diagnosis of combined forms age related cataract, right eye primary open angle glaucoma, right eye.  The various methods of treatment have been discussed with the patient and family. After consideration of risks, benefits and other options for treatment, the patient has consented to  Procedure(s) with comments: PHACOEMULSIFICATION, CATARACT, WITH IOL INSERTION (Right) - CDE: INSERTION, STENT, DRUG-ELUTING, LACRIMAL CANALICULUS (Right) GONIOTOMY (Right) - iAccess Goniotomy as a surgical intervention.  The patient's history has been reviewed, patient examined, no change in status, stable for surgery.  I have reviewed the patient's chart and labs.  Questions were answered to the patient's satisfaction.     HARRIE AGENT

## 2024-07-10 NOTE — Discharge Instructions (Signed)
 Please discharge patient when stable, will follow up today with Dr. June Leap at the [facility] office immediately following discharge.  Leave shield in place until visit.  All paperwork with discharge instructions will be given at the office.  [facility] Address:  [address]  [place], [place] [address]

## 2024-07-10 NOTE — Transfer of Care (Addendum)
 Immediate Anesthesia Transfer of Care Note  Patient: Janice Gonzales  Procedure(s) Performed: PHACOEMULSIFICATION, CATARACT, WITH IOL INSERTION (Right: Eye) INSERTION, STENT, DRUG-ELUTING, LACRIMAL CANALICULUS (Right: Eye) GONIOTOMY (Right: Eye)  Patient Location: Short Stay  Anesthesia Type:MAC  Level of Consciousness: awake, alert , and patient cooperative  Airway & Oxygen Therapy: Patient Spontanous Breathing  Post-op Assessment: Report given to RN and Post -op Vital signs reviewed and stable  Post vital signs: Reviewed and stable  Last Vitals:  Vitals Value Taken Time  BP 128/81 07/10/24   10:01  Temp 37 07/10/24   10:01  Pulse 83 07/10/24   10:01  Resp 16 07/10/24   10:01  SpO2 97% 07/10/24   10:01    Last Pain:  Vitals:   07/10/24 0847  TempSrc: Oral  PainSc: 0-No pain      Patients Stated Pain Goal: 6 (07/10/24 0847)  Complications: No notable events documented.

## 2024-07-10 NOTE — Anesthesia Postprocedure Evaluation (Signed)
 Anesthesia Post Note  Patient: Janice Gonzales  Procedure(s) Performed: PHACOEMULSIFICATION, CATARACT, WITH IOL INSERTION (Right: Eye) INSERTION, STENT, DRUG-ELUTING, LACRIMAL CANALICULUS (Right: Eye) GONIOTOMY (Right: Eye)  Patient location during evaluation: Phase II Anesthesia Type: MAC Level of consciousness: awake and alert Pain management: pain level controlled Vital Signs Assessment: post-procedure vital signs reviewed and stable Respiratory status: spontaneous breathing, nonlabored ventilation and respiratory function stable Cardiovascular status: stable Anesthetic complications: no   There were no known notable events for this encounter.   Last Vitals:  Vitals:   07/10/24 0847 07/10/24 1001  BP: (!) 147/82 126/81  Pulse: 79 84  Resp: 16 16  Temp: 36.8 C 37 C  SpO2: 97% 97%    Last Pain:  Vitals:   07/10/24 1001  TempSrc: Oral  PainSc: 0-No pain                 Leeta Grimme L Jerrico Covello

## 2024-07-14 DIAGNOSIS — R7303 Prediabetes: Secondary | ICD-10-CM | POA: Diagnosis not present

## 2024-07-14 DIAGNOSIS — E782 Mixed hyperlipidemia: Secondary | ICD-10-CM | POA: Diagnosis not present

## 2024-07-14 DIAGNOSIS — E559 Vitamin D deficiency, unspecified: Secondary | ICD-10-CM | POA: Diagnosis not present

## 2024-07-20 DIAGNOSIS — E875 Hyperkalemia: Secondary | ICD-10-CM | POA: Diagnosis not present

## 2024-07-20 DIAGNOSIS — E782 Mixed hyperlipidemia: Secondary | ICD-10-CM | POA: Diagnosis not present

## 2024-07-20 DIAGNOSIS — J302 Other seasonal allergic rhinitis: Secondary | ICD-10-CM | POA: Diagnosis not present

## 2024-07-20 DIAGNOSIS — Z23 Encounter for immunization: Secondary | ICD-10-CM | POA: Diagnosis not present

## 2024-07-20 DIAGNOSIS — E559 Vitamin D deficiency, unspecified: Secondary | ICD-10-CM | POA: Diagnosis not present

## 2024-07-20 DIAGNOSIS — M25562 Pain in left knee: Secondary | ICD-10-CM | POA: Diagnosis not present

## 2024-07-20 DIAGNOSIS — G2581 Restless legs syndrome: Secondary | ICD-10-CM | POA: Diagnosis not present

## 2024-07-20 DIAGNOSIS — H409 Unspecified glaucoma: Secondary | ICD-10-CM | POA: Diagnosis not present

## 2024-07-20 DIAGNOSIS — G4730 Sleep apnea, unspecified: Secondary | ICD-10-CM | POA: Diagnosis not present

## 2024-07-20 DIAGNOSIS — I10 Essential (primary) hypertension: Secondary | ICD-10-CM | POA: Diagnosis not present

## 2024-07-20 DIAGNOSIS — R7303 Prediabetes: Secondary | ICD-10-CM | POA: Diagnosis not present

## 2024-07-20 DIAGNOSIS — H2513 Age-related nuclear cataract, bilateral: Secondary | ICD-10-CM | POA: Diagnosis not present
